# Patient Record
Sex: Female | Born: 1965 | Race: White | Hispanic: No | Marital: Married | Smoking: Never smoker
Health system: Southern US, Community
[De-identification: ages and names within clinical notes are randomized; demographics above are authoritative.]

## PROBLEM LIST (undated history)

## (undated) DIAGNOSIS — N83209 Unspecified ovarian cyst, unspecified side: Secondary | ICD-10-CM

## (undated) DIAGNOSIS — Z8041 Family history of malignant neoplasm of ovary: Secondary | ICD-10-CM

## (undated) DIAGNOSIS — N9489 Other specified conditions associated with female genital organs and menstrual cycle: Secondary | ICD-10-CM

## (undated) HISTORY — PX: WISDOM TOOTH EXTRACTION: SHX21

## (undated) HISTORY — PX: ADENOIDECTOMY: SHX5191

## (undated) HISTORY — DX: Unspecified ovarian cyst, unspecified side: N83.209

## (undated) HISTORY — DX: Family history of malignant neoplasm of ovary: Z80.41

---

## 2015-07-02 ENCOUNTER — Encounter: Payer: Self-pay | Admitting: Family Medicine

## 2015-07-02 ENCOUNTER — Ambulatory Visit (INDEPENDENT_AMBULATORY_CARE_PROVIDER_SITE_OTHER): Payer: 59 | Admitting: Family Medicine

## 2015-07-02 VITALS — BP 136/86 | HR 74 | Temp 97.5°F | Resp 20 | Ht 66.0 in | Wt 147.5 lb

## 2015-07-02 DIAGNOSIS — Z13220 Encounter for screening for lipoid disorders: Secondary | ICD-10-CM

## 2015-07-02 DIAGNOSIS — Z0000 Encounter for general adult medical examination without abnormal findings: Secondary | ICD-10-CM

## 2015-07-02 DIAGNOSIS — R1013 Epigastric pain: Secondary | ICD-10-CM

## 2015-07-02 DIAGNOSIS — Z7189 Other specified counseling: Secondary | ICD-10-CM | POA: Diagnosis not present

## 2015-07-02 DIAGNOSIS — Z131 Encounter for screening for diabetes mellitus: Secondary | ICD-10-CM

## 2015-07-02 DIAGNOSIS — Z130 Encounter for screening for diseases of the blood and blood-forming organs and certain disorders involving the immune mechanism: Secondary | ICD-10-CM

## 2015-07-02 DIAGNOSIS — Z1329 Encounter for screening for other suspected endocrine disorder: Secondary | ICD-10-CM | POA: Diagnosis not present

## 2015-07-02 DIAGNOSIS — Z7689 Persons encountering health services in other specified circumstances: Secondary | ICD-10-CM

## 2015-07-02 NOTE — Progress Notes (Signed)
Patient ID: Mary Warren, female   DOB: 29-Jun-1965, 50 y.o.   MRN: [id number]      Patient ID: Mary Warren, female  DOB: 1966-01-29, 50 y.o.   MRN: [id number]  Subjective:  Mary Warren is a 50 y.o. female present for establishment of care with acute complaint.  All past medical history, surgical history, allergies, family history, immunizations, medications and social history were entered in the electronic medical record today. All recent labs, ED visits and hospitalizations within the last year were reviewed.  Epigastric discomfort: Patient states that approximately 1 week ago she experienced epigastric discomfort just below the sternal area. She states she felt a pop, and then she was sore in the area for a few days after. She denies any nausea, vomit, diarrhea or lower abdominal pain. She denies any history of gallbladder disease. She states this occurred after lifting/working outdoors. This has not happened prior. She denies any reflux like symptoms.  Health maintenance:  Colonoscopy:Mammogram: No known family history, routine screening at 45 Cervical cancer screening: Ovarian cancer in mother, patient with routine Paps last Pap smear 2014. Patient reports no recent abnormal Paps. Patient continues her regular menses. Immunizations: Up-to-date with tetanus shot 2012, declines flu vaccination. Infectious disease screening: HIV completed 2012  History reviewed. No pertinent past medical history. No Known Allergies Past Surgical History  Procedure Laterality Date  . Adenoidectomy     Family History  Problem Relation Age of Onset  . Drug abuse Mother   . COPD Mother   . Cancer Mother   . Mental retardation Mother   . Early death Mother 68    blood clot  . Ovarian cancer Mother   . Heart disease Mother   . Cancer Paternal Aunt   . Breast cancer Paternal Aunt   . Alcohol abuse Sister    Social History   Social History  . Marital Status: Married    Spouse Name: N/A  .  Number of Children: N/A  . Years of Education: N/A   Occupational History  . Not on file.   Social History Main Topics  . Smoking status: Never Smoker   . Smokeless tobacco: Not on file  . Alcohol Use: 1.2 - 1.8 oz/week    2-3 Glasses of wine per week  . Drug Use: No  . Sexual Activity: Yes    Birth Control/ Protection: None   Other Topics Concern  . Not on file   Social History Narrative   Husband, Warden Fillers.   College degree. [employer]    ROS: Negative, with the exception of above mentioned in HPI  Objective: BP 136/86 mmHg  Pulse 74  Temp(Src) 97.5 F (36.4 C)  Resp 20  Ht '5\' 6"'  (1.676 m)  Wt 147 lb 8 oz (66.906 kg)  BMI 23.82 kg/m2  SpO2 99%  LMP 06/07/2015 (Approximate) Gen: Afebrile. No acute distress. Nontoxic in appearance, well-developed, well-nourished, female, Pleasant HENT: AT. [place]. Bilateral TM visualized and normal in appearance, normal external auditory canal. MMM, no oral lesions. Bilateral nares and throat within normal limits.  Eyes:Pupils Equal Round Reactive to light, Extraocular movements intact,  Conjunctiva without redness, discharge or icterus. Neck/lymp/endocrine: Supple, no lymphadenopathy CV: RRR no murmur, no edema, +2/4 P posterior tibialis pulses.  Chest: CTAB, no wheeze, rhonchi or crackles. * Abd: Soft. NTND. BS present. No masses palpated. No hepatosplenomegaly. No rebound or guarding. Skin: no rashes, purpura or petechiae Neuro/Msk: Normal gait. PERLA. EOMi. Alert. Oriented x3.    Assessment/plan: Mary Warren is  a 50 y.o. female present for establishment of care. Establishing care with new doctor, encounter for - Future labs place for preventive visit which patient was encouraged to schedule within 3-4 months. Patient was encouraged to have labs drawn approximately 2-3 days prior to scheduling the appointment for her preventative that we can discuss on the day of her appointment. - CBC w/Diff; Future - Comp Met (CMET); Future -  TSH; Future - Lipid Profile; Future - HgB A1c; Future  Epigastric discomfort - Discussed with patient what she describes the abdomen likely muscle skeletal/rib strain in nature or possibly a small hiatal hernia that is sliding. - Encourage patient to monitor for future occurrences, consider NSAID use if felt to be muscle skeletal. Patient was also educated on hiatal hernia, and other associated signs and symptoms that frequent. - Follow-up as needed   Return in about 3 months (around 10/02/2015), or cpe with pap, for CPE.  Electronically signed by: Howard Pouch, DO [place]

## 2015-07-02 NOTE — Patient Instructions (Signed)
It was a pleasure meeting you today. If you need anything we are here for you. Otherwise, make a appt within 1-52mos for annual with fasting labs prior.

## 2015-09-03 ENCOUNTER — Other Ambulatory Visit (INDEPENDENT_AMBULATORY_CARE_PROVIDER_SITE_OTHER): Payer: 59

## 2015-09-03 ENCOUNTER — Encounter (INDEPENDENT_AMBULATORY_CARE_PROVIDER_SITE_OTHER): Payer: Self-pay

## 2015-09-03 DIAGNOSIS — Z1329 Encounter for screening for other suspected endocrine disorder: Secondary | ICD-10-CM | POA: Diagnosis not present

## 2015-09-03 DIAGNOSIS — Z0000 Encounter for general adult medical examination without abnormal findings: Secondary | ICD-10-CM | POA: Diagnosis not present

## 2015-09-03 DIAGNOSIS — Z130 Encounter for screening for diseases of the blood and blood-forming organs and certain disorders involving the immune mechanism: Secondary | ICD-10-CM

## 2015-09-03 DIAGNOSIS — Z13220 Encounter for screening for lipoid disorders: Secondary | ICD-10-CM | POA: Diagnosis not present

## 2015-09-03 DIAGNOSIS — Z131 Encounter for screening for diabetes mellitus: Secondary | ICD-10-CM | POA: Diagnosis not present

## 2015-09-03 LAB — CBC WITH DIFFERENTIAL/PLATELET
BASOS PCT: 0.6 % (ref 0.0–3.0)
Basophils Absolute: 0 10*3/uL (ref 0.0–0.1)
EOS ABS: 0 10*3/uL (ref 0.0–0.7)
EOS PCT: 0.5 % (ref 0.0–5.0)
HCT: 37.8 % (ref 36.0–46.0)
Hemoglobin: 12.8 g/dL (ref 12.0–15.0)
LYMPHS ABS: 1.6 10*3/uL (ref 0.7–4.0)
Lymphocytes Relative: 31.7 % (ref 12.0–46.0)
MCHC: 33.8 g/dL (ref 30.0–36.0)
MCV: 92.1 fl (ref 78.0–100.0)
MONO ABS: 0.3 10*3/uL (ref 0.1–1.0)
Monocytes Relative: 6.5 % (ref 3.0–12.0)
NEUTROS ABS: 3.1 10*3/uL (ref 1.4–7.7)
NEUTROS PCT: 60.7 % (ref 43.0–77.0)
PLATELETS: 243 10*3/uL (ref 150.0–400.0)
RBC: 4.11 Mil/uL (ref 3.87–5.11)
RDW: 13.9 % (ref 11.5–15.5)
WBC: 5.1 10*3/uL (ref 4.0–10.5)

## 2015-09-03 LAB — HEMOGLOBIN A1C: Hgb A1c MFr Bld: 5.4 % (ref 4.6–6.5)

## 2015-09-03 LAB — COMPREHENSIVE METABOLIC PANEL
ALT: 11 U/L (ref 0–35)
AST: 13 U/L (ref 0–37)
Albumin: 4.4 g/dL (ref 3.5–5.2)
Alkaline Phosphatase: 37 U/L — ABNORMAL LOW (ref 39–117)
BUN: 14 mg/dL (ref 6–23)
CHLORIDE: 107 meq/L (ref 96–112)
CO2: 26 meq/L (ref 19–32)
CREATININE: 0.55 mg/dL (ref 0.40–1.20)
Calcium: 9.5 mg/dL (ref 8.4–10.5)
GFR: 125.12 mL/min (ref >60.00)
Glucose, Bld: 95 mg/dL (ref 70–99)
POTASSIUM: 4.7 meq/L (ref 3.5–5.1)
SODIUM: 140 meq/L (ref 135–145)
Total Bilirubin: 0.8 mg/dL (ref 0.2–1.2)
Total Protein: 7 g/dL (ref 6.0–8.3)

## 2015-09-03 LAB — LIPID PANEL
CHOL/HDL RATIO: 4
Cholesterol: 198 mg/dL (ref 0–200)
HDL: 54.3 mg/dL (ref >39.00)
LDL CALC: 130 mg/dL — AB (ref 0–99)
NonHDL: 143.23
Triglycerides: 68 mg/dL (ref 0.0–149.0)
VLDL: 13.6 mg/dL (ref 0.0–40.0)

## 2015-09-03 LAB — TSH: TSH: 3.62 u[IU]/mL (ref 0.35–4.50)

## 2015-09-07 ENCOUNTER — Other Ambulatory Visit (HOSPITAL_COMMUNITY)
Admission: RE | Admit: 2015-09-07 | Discharge: 2015-09-07 | Disposition: A | Discharge status: Home / Self Care | Payer: 59 | Source: Ambulatory Visit | Attending: Family Medicine | Admitting: Family Medicine

## 2015-09-07 ENCOUNTER — Ambulatory Visit (INDEPENDENT_AMBULATORY_CARE_PROVIDER_SITE_OTHER): Payer: 59 | Admitting: Family Medicine

## 2015-09-07 ENCOUNTER — Encounter: Payer: Self-pay | Admitting: Family Medicine

## 2015-09-07 VITALS — BP 113/72 | HR 74 | Temp 98.3°F | Resp 20 | Ht 66.0 in | Wt 146.8 lb

## 2015-09-07 DIAGNOSIS — Z1151 Encounter for screening for human papillomavirus (HPV): Secondary | ICD-10-CM | POA: Insufficient documentation

## 2015-09-07 DIAGNOSIS — Z1231 Encounter for screening mammogram for malignant neoplasm of breast: Secondary | ICD-10-CM

## 2015-09-07 DIAGNOSIS — Z0000 Encounter for general adult medical examination without abnormal findings: Secondary | ICD-10-CM | POA: Diagnosis not present

## 2015-09-07 DIAGNOSIS — Z01419 Encounter for gynecological examination (general) (routine) without abnormal findings: Secondary | ICD-10-CM | POA: Insufficient documentation

## 2015-09-07 DIAGNOSIS — N649 Disorder of breast, unspecified: Secondary | ICD-10-CM

## 2015-09-07 DIAGNOSIS — Z124 Encounter for screening for malignant neoplasm of cervix: Secondary | ICD-10-CM

## 2015-09-07 DIAGNOSIS — L988 Other specified disorders of the skin and subcutaneous tissue: Secondary | ICD-10-CM | POA: Insufficient documentation

## 2015-09-07 NOTE — Progress Notes (Signed)
Patient ID: Mary Warren, female   DOB: 13-Jun-1965, 50 y.o.   MRN: [id number]      Patient ID: Mary Warren, female  DOB: Feb 25, 1966, 50 y.o.   MRN: [id number]  Subjective:  Mary Warren is a 50 y.o. female present for annual exam/CPE and PAP.  All past medical history, surgical history, allergies, family history, immunizations, medications and social history were updated in the electronic medical record today. All recent labs, ED visits and hospitalizations within the last year were reviewed.  Well women exam: Patient states her menstrual cycle occurs approximately every 28-30 days, and will last approximately 7 days in length. She states her cycle alternates between heavy and light cycles. She does not sleep through a pad or tampon greater than 2 hours. She denies any vaginal discharge, lesions or dyspareunia. Patient performs self breast exams, denies any masses or suspicious lumps. Patient has a family history of breast cancer in her paternal aunt. She has never had a screening mammogram secondary to consider radiation exposure. There is no family history of colon cancer. Patient's last menstrual period was 08/09/2015.  Skin Lesion: pt states she has right breast skin lesion after a sunburn in the past. She has noticed it is remaining, not healing or going away over the past 2 months. She has never had skin cancer.   Health maintenance:  Colonoscopy: No FHX, routine screen 51 Mammogram: FH in Pat aunt (60), routine screening should start---> ordered today, pt has "reservations of radiation exposure" Cervical cancer screening: Ovarian cancer in mother, patient with routine Paps last Pap smear 2014. Patient reports no recent abnormal Paps. Patient continues her regular menses. Immunizations: Up-to-date with tetanus shot 2012, declines flu vaccination. Infectious disease screening: HIV completed 2012 Assistive device: None  Oxygen [facility]:EYEM  Patient has a Dental home. Hospitalizations/ED  visits: None   Recent Results (from the past 2160 hour(s))  CBC w/Diff     Status: None   Collection Time: 09/03/15  8:04 AM  Result Value Ref Range   WBC 5.1 4.0 - 10.5 K/uL   RBC 4.11 3.87 - 5.11 Mil/uL   Hemoglobin 12.8 12.0 - 15.0 g/dL   HCT 37.8 36.0 - 46.0 %   MCV 92.1 78.0 - 100.0 fl   MCHC 33.8 30.0 - 36.0 g/dL   RDW 13.9 11.5 - 15.5 %   Platelets 243.0 150.0 - 400.0 K/uL   Neutrophils Relative % 60.7 43.0 - 77.0 %   Lymphocytes Relative 31.7 12.0 - 46.0 %   Monocytes Relative 6.5 3.0 - 12.0 %   Eosinophils Relative 0.5 0.0 - 5.0 %   Basophils Relative 0.6 0.0 - 3.0 %   Neutro Abs 3.1 1.4 - 7.7 K/uL   Lymphs Abs 1.6 0.7 - 4.0 K/uL   Monocytes Absolute 0.3 0.1 - 1.0 K/uL   Eosinophils Absolute 0.0 0.0 - 0.7 K/uL   Basophils Absolute 0.0 0.0 - 0.1 K/uL  Comp Met (CMET)     Status: Abnormal   Collection Time: 09/03/15  8:04 AM  Result Value Ref Range   Sodium 140 135 - 145 mEq/L   Potassium 4.7 3.5 - 5.1 mEq/L   Chloride 107 96 - 112 mEq/L   CO2 26 19 - 32 mEq/L   Glucose, Bld 95 70 - 99 mg/dL   BUN 14 6 - 23 mg/dL   Creatinine, Ser 0.55 0.40 - 1.20 mg/dL   Total Bilirubin 0.8 0.2 - 1.2 mg/dL   Alkaline Phosphatase 37 (L) 39 - 117 U/L  AST 13 0 - 37 U/L   ALT 11 0 - 35 U/L   Total Protein 7.0 6.0 - 8.3 g/dL   Albumin 4.4 3.5 - 5.2 g/dL   Calcium 9.5 8.4 - 10.5 mg/dL   GFR 125.12 >60.00 mL/min  TSH     Status: None   Collection Time: 09/03/15  8:04 AM  Result Value Ref Range   TSH 3.62 0.35 - 4.50 uIU/mL  Lipid Profile     Status: Abnormal   Collection Time: 09/03/15  8:04 AM  Result Value Ref Range   Cholesterol 198 0 - 200 mg/dL    Comment: ATP III Classification       Desirable:  < 200 mg/dL               Borderline High:  200 - 239 mg/dL          High:  > = 240 mg/dL   Triglycerides 68.0 0.0 - 149.0 mg/dL    Comment: Normal:  <150 mg/dLBorderline High:  150 - 199 mg/dL   HDL 54.30 >39.00 mg/dL   VLDL 13.6 0.0 - 40.0 mg/dL   LDL Cholesterol 130 (H) 0 -  99 mg/dL   Total CHOL/HDL Ratio 4     Comment:                Men          Women1/2 Average Risk     3.4          3.3Average Risk          5.0          4.42X Average Risk          9.6          7.13X Average Risk          15.0          11.0                       NonHDL 143.23     Comment: NOTE:  Non-HDL goal should be 30 mg/dL higher than patient's LDL goal (i.e. LDL goal of < 70 mg/dL, would have non-HDL goal of < 100 mg/dL)  HgB A1c     Status: None   Collection Time: 09/03/15  8:04 AM  Result Value Ref Range   Hgb A1c MFr Bld 5.4 4.6 - 6.5 %    Comment: Glycemic Control Guidelines for People with Diabetes:Non Diabetic:  <6%Goal of Therapy: <7%Additional Action Suggested:  >8%      History reviewed. No pertinent past medical history. No Known Allergies Past Surgical History  Procedure Laterality Date  . Adenoidectomy     Family History  Problem Relation Age of Onset  . Drug abuse Mother   . COPD Mother   . Cancer Mother   . Mental retardation Mother   . Early death Mother 42    blood clot  . Ovarian cancer Mother   . Heart disease Mother   . Cancer Paternal Aunt   . Breast cancer Paternal Aunt   . Alcohol abuse Sister    Social History   Social History  . Marital Status: Married    Spouse Name: N/A  . Number of Children: N/A  . Years of Education: N/A   Occupational History  . Not on file.   Social History Main Topics  . Smoking status: Never Smoker   . Smokeless tobacco: Not on file  . Alcohol Use:  1.2 - 1.8 oz/week    2-3 Glasses of wine per week  . Drug Use: No  . Sexual Activity: Yes    Birth Control/ Protection: None   Other Topics Concern  . 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   Exercises but nor routinely.    Smoke detector in the home.   Wears her set belt.   Feels safe in her relationships.        ROS: Negative, with the exception of above mentioned in  HPI  Objective: BP 113/72 mmHg  Pulse 74  Temp(Src) 98.3 F (36.8 C) (Oral)  Resp 20  Ht _0  (1.676 m)  Wt 146 lb 12 oz (66.565 kg)  BMI 23.70 kg/m2  SpO2 98%  LMP 08/09/2015 Gen: Afebrile. No acute distress. Nontoxic in appearance, well-developed, well-nourished,Caucasian female, Pleasant HENT: AT. [place]. Bilateral TM visualized and normal in appearance, normal external auditory canal. MMM, no oral lesions, good dentition. Bilateral nares without erythema or swelling. Throat without erythema, ulcerations or exudates. No Cough on exam, no hoarseness on exam. Eyes:Pupils Equal Round Reactive to light, Extraocular movements intact,  Conjunctiva without redness, discharge or icterus. Neck/lymp/endocrine: Supple, no lymphadenopathy, no thyromegaly CV: RRR no murmur appreciated, no edema, +2/4 P posterior tibialis pulses. No carotid bruits. No JVD. Chest: CTAB, no wheeze, rhonchi or crackles. Normal Respiratory effort. Good Air movement. Abd: Soft. Flat. NTND. BS present. No Masses palpated. No hepatosplenomegaly. No rebound tenderness or guarding. Skin: No rashes, purpura or petechiae. Warm and well-perfused. Skin intact. Small hyperpigmented pearly/scaly <0.5cm skin lesion right chest wall. Neuro/Msk:  Normal gait. PERLA. EOMi. Alert. Oriented x3.  Cranial nerves II through XII intact. Muscle strength 5/5 upper and lower extremity. DTRs equal bilaterally. Psych: Normal affect, dress and demeanor. Normal speech. Normal thought content and judgment. Breasts: breasts appear normal, symmetrical, no tenderness on exam, no suspicious masses, no skin or nipple changes or axillary nodes. GYN:  External genitalia within normal limits, normal hair distribution, no lesions. Urethral meatus normal, no lesions. Vaginal mucosa pink, moist, normal rugae, no lesions. No cystocele or rectocele. cervix without lesions, no discharge, mildly friable. Bimanual exam revealed normal uterus.  No bladder/suprapubic  fullness, masses or tenderness. No cervical motion tenderness. No adnexal fullness. Anus and perineum within normal limits, no lesions.  Assessment/plan: Mary Warren is a 50 y.o. female present for annual exam.  1. Encounter for preventive health examination - labs reviewed with patient in detail.  Patient was encouraged to exercise greater than 150 minutes a week. Patient was encouraged to choose a diet filled with fresh fruits and vegetables, and lean meats. AVS provided to patient today for education/recommendation on gender specific health and safety maintenance. Colonoscopy: No FHX, routine screen 51 Mammogram: FH in Pat aunt (60), routine screening should start---> ordered today, pt has "reservations of radiation exposure" Cervical cancer screening: Completed today Immunizations: Up-to-date with tetanus shot 2012, declines flu vaccination. Infectious disease screening: HIV completed 2012 Patient encouraged to continue vitamin D supplementation and calcium. Continue fish oil supplementation.  2. Encounter for screening mammogram for breast cancer - Pt has some concerns of radiation exposure.  - MM DIGITAL SCREENING BILATERAL; Future  3. Encounter for screening for cervical cancer  - PAP with HPV co- testing completed today.   4. Skin lesion of breast -? BCC, pt counseled on sun screen use.  - Ambulatory referral to Dermatology  Return in about 1  year (around 09/06/2016) for CPE.  Electronically signed by: Howard Pouch, DO [place]

## 2015-09-07 NOTE — Patient Instructions (Signed)
Health Maintenance, Female Adopting a healthy lifestyle and getting preventive care can go a long way to promote health and wellness. Talk with your health care provider about what schedule of regular examinations is right for you. This is a good chance for you to check in with your provider about disease prevention and staying healthy. In between checkups, there are plenty of things you can do on your own. Experts have done a lot of research about which lifestyle changes and preventive measures are most likely to keep you healthy. Ask your health care provider for more information. WEIGHT AND DIET  Eat a healthy diet  Be sure to include plenty of vegetables, fruits, low-fat dairy products, and lean protein.  Do not eat a lot of foods high in solid fats, added sugars, or salt.  Get regular exercise. This is one of the most important things you can do for your health.  Most adults should exercise for at least 150 minutes each week. The exercise should increase your heart rate and make you sweat (moderate-intensity exercise).  Most adults should also do strengthening exercises at least twice a week. This is in addition to the moderate-intensity exercise.  Maintain a healthy weight  Body mass index (BMI) is a measurement that can be used to identify possible weight problems. It estimates body fat based on height and weight. Your health care provider can help determine your BMI and help you achieve or maintain a healthy weight.  For females 20 years of age and older:   A BMI below 18.5 is considered underweight.  A BMI of 18.5 to 24.9 is normal.  A BMI of 25 to 29.9 is considered overweight.  A BMI of 30 and above is considered obese.  Watch levels of cholesterol and blood lipids  You should start having your blood tested for lipids and cholesterol at 49 years of age, then have this test every 5 years.  You may need to have your cholesterol levels checked more often if:  Your lipid  or cholesterol levels are high.  You are older than 50 years of age.  You are at high risk for heart disease.  CANCER SCREENING   Lung Cancer  Lung cancer screening is recommended for adults 55-80 years old who are at high risk for lung cancer because of a history of smoking.  A yearly low-dose CT scan of the lungs is recommended for people who:  Currently smoke.  Have quit within the past 15 years.  Have at least a 30-pack-year history of smoking. A pack year is smoking an average of one pack of cigarettes a day for 1 year.  Yearly screening should continue until it has been 15 years since you quit.  Yearly screening should stop if you develop a health problem that would prevent you from having lung cancer treatment.  Breast Cancer  Practice breast self-awareness. This means understanding how your breasts normally appear and feel.  It also means doing regular breast self-exams. Let your health care provider know about any changes, no matter how small.  If you are in your 20s or 30s, you should have a clinical breast exam (CBE) by a health care provider every 1-3 years as part of a regular health exam.  If you are 40 or older, have a CBE every year. Also consider having a breast X-ray (mammogram) every year.  If you have a family history of breast cancer, talk to your health care provider about genetic screening.  If you   are at high risk for breast cancer, talk to your health care provider about having an MRI and a mammogram every year.  Breast cancer gene (BRCA) assessment is recommended for women who have family members with BRCA-related cancers. BRCA-related cancers include:  Breast.  Ovarian.  Tubal.  Peritoneal cancers.  Results of the assessment will determine the need for genetic counseling and BRCA1 and BRCA2 testing. Cervical Cancer Your health care provider may recommend that you be screened regularly for cancer of the pelvic organs (ovaries, uterus, and  vagina). This screening involves a pelvic examination, including checking for microscopic changes to the surface of your cervix (Pap test). You may be encouraged to have this screening done every 3 years, beginning at age 21.  For women ages 30-65, health care providers may recommend pelvic exams and Pap testing every 3 years, or they may recommend the Pap and pelvic exam, combined with testing for human papilloma virus (HPV), every 5 years. Some types of HPV increase your risk of cervical cancer. Testing for HPV may also be done on women of any age with unclear Pap test results.  Other health care providers may not recommend any screening for nonpregnant women who are considered low risk for pelvic cancer and who do not have symptoms. Ask your health care provider if a screening pelvic exam is right for you.  If you have had past treatment for cervical cancer or a condition that could lead to cancer, you need Pap tests and screening for cancer for at least 20 years after your treatment. If Pap tests have been discontinued, your risk factors (such as having a new sexual partner) need to be reassessed to determine if screening should resume. Some women have medical problems that increase the chance of getting cervical cancer. In these cases, your health care provider may recommend more frequent screening and Pap tests. Colorectal Cancer  This type of cancer can be detected and often prevented.  Routine colorectal cancer screening usually begins at 50 years of age and continues through 49 years of age.  Your health care provider may recommend screening at an earlier age if you have risk factors for colon cancer.  Your health care provider may also recommend using home test kits to check for hidden blood in the stool.  A small camera at the end of a tube can be used to examine your colon directly (sigmoidoscopy or colonoscopy). This is done to check for the earliest forms of colorectal  cancer.  Routine screening usually begins at age 50.  Direct examination of the colon should be repeated every 5-10 years through 49 years of age. However, you may need to be screened more often if early forms of precancerous polyps or small growths are found. Skin Cancer  Check your skin from head to toe regularly.  Tell your health care provider about any new moles or changes in moles, especially if there is a change in a mole's shape or color.  Also tell your health care provider if you have a mole that is larger than the size of a pencil eraser.  Always use sunscreen. Apply sunscreen liberally and repeatedly throughout the day.  Protect yourself by wearing long sleeves, pants, a wide-brimmed hat, and sunglasses whenever you are outside. HEART DISEASE, DIABETES, AND HIGH BLOOD PRESSURE   High blood pressure causes heart disease and increases the risk of stroke. High blood pressure is more likely to develop in:  People who have blood pressure in the high end   of the normal range (130-139/85-89 mm Hg).  People who are overweight or obese.  People who are African American.  If you are 38-23 years of age, have your blood pressure checked every 3-5 years. If you are 61 years of age or older, have your blood pressure checked every year. You should have your blood pressure measured twice--once when you are at a hospital or clinic, and once when you are not at a hospital or clinic. Record the average of the two measurements. To check your blood pressure when you are not at a hospital or clinic, you can use:  An automated blood pressure machine at a pharmacy.  A home blood pressure monitor.  If you are between 45 years and 39 years old, ask your health care provider if you should take aspirin to prevent strokes.  Have regular diabetes screenings. This involves taking a blood sample to check your fasting blood sugar level.  If you are at a normal weight and have a low risk for diabetes,  have this test once every three years after 49 years of age.  If you are overweight and have a high risk for diabetes, consider being tested at a younger age or more often. PREVENTING INFECTION  Hepatitis B  If you have a higher risk for hepatitis B, you should be screened for this virus. You are considered at high risk for hepatitis B if:  You were born in a country where hepatitis B is common. Ask your health care provider which countries are considered high risk.  Your parents were born in a high-risk country, and you have not been immunized against hepatitis B (hepatitis B vaccine).  You have HIV or AIDS.  You use needles to inject street drugs.  You live with someone who has hepatitis B.  You have had sex with someone who has hepatitis B.  You get hemodialysis treatment.  You take certain medicines for conditions, including cancer, organ transplantation, and autoimmune conditions. Hepatitis C  Blood testing is recommended for:  Everyone born from [birth year] through 1964.  Anyone with known risk factors for hepatitis C. Sexually transmitted infections (STIs)  You should be screened for sexually transmitted infections (STIs) including gonorrhea and chlamydia if:  You are sexually active and are younger than 49 years of age.  You are older than 49 years of age and your health care provider tells you that you are at risk for this type of infection.  Your sexual activity has changed since you were last screened and you are at an increased risk for chlamydia or gonorrhea. Ask your health care provider if you are at risk.  If you do not have HIV, but are at risk, it may be recommended that you take a prescription medicine daily to prevent HIV infection. This is called pre-exposure prophylaxis (PrEP). You are considered at risk if:  You are sexually active and do not regularly use condoms or know the HIV status of your partner(s).  You take drugs by injection.  You are sexually  active with a partner who has HIV. Talk with your health care provider about whether you are at high risk of being infected with HIV. If you choose to begin PrEP, you should first be tested for HIV. You should then be tested every 3 months for as long as you are taking PrEP.  PREGNANCY   If you are premenopausal and you may become pregnant, ask your health care provider about preconception counseling.  If you may  become pregnant, take 400 to 800 micrograms (mcg) of folic acid every day.  If you want to prevent pregnancy, talk to your health care provider about birth control (contraception). OSTEOPOROSIS AND MENOPAUSE   Osteoporosis is a disease in which the bones lose minerals and strength with aging. This can result in serious bone fractures. Your risk for osteoporosis can be identified using a bone density scan.  If you are 61 years of age or older, or if you are at risk for osteoporosis and fractures, ask your health care provider if you should be screened.  Ask your health care provider whether you should take a calcium or vitamin D supplement to lower your risk for osteoporosis.  Menopause may have certain physical symptoms and risks.  Hormone replacement therapy may reduce some of these symptoms and risks. Talk to your health care provider about whether hormone replacement therapy is right for you.  HOME CARE INSTRUCTIONS   Schedule regular health, dental, and eye exams.  Stay current with your immunizations.   Do not use any tobacco products including cigarettes, chewing tobacco, or electronic cigarettes.  If you are pregnant, do not drink alcohol.  If you are breastfeeding, limit how much and how often you drink alcohol.  Limit alcohol intake to no more than 1 drink per day for nonpregnant women. One drink equals 12 ounces of beer, 5 ounces of wine, or 1 ounces of hard liquor.  Do not use street drugs.  Do not share needles.  Ask your health care provider for help if  you need support or information about quitting drugs.  Tell your health care provider if you often feel depressed.  Tell your health care provider if you have ever been abused or do not feel safe at home.   This information is not intended to replace advice given to you by your health care provider. Make sure you discuss any questions you have with your health care provider.   Document Released: 10/24/2010 Document Revised: 05/01/2014 Document Reviewed: 03/12/2013 Elsevier Interactive Patient Education [employer].

## 2015-09-08 LAB — CYTOLOGY - PAP

## 2015-09-09 ENCOUNTER — Telehealth: Payer: Self-pay | Admitting: Family Medicine

## 2015-09-09 NOTE — Telephone Encounter (Signed)
Please call pt: - PAP smear is normal, with neg HPV co-testing--> next PAP 3 years

## 2015-09-09 NOTE — Telephone Encounter (Signed)
Left message with results on patient voice mail 

## 2015-10-15 ENCOUNTER — Ambulatory Visit
Admission: RE | Admit: 2015-10-15 | Discharge: 2015-10-15 | Disposition: A | Discharge status: Home / Self Care | Payer: 59 | Source: Ambulatory Visit | Attending: Family Medicine | Admitting: Family Medicine

## 2015-10-15 ENCOUNTER — Other Ambulatory Visit: Payer: Self-pay | Admitting: Family Medicine

## 2015-10-15 DIAGNOSIS — Z1231 Encounter for screening mammogram for malignant neoplasm of breast: Secondary | ICD-10-CM

## 2016-08-07 ENCOUNTER — Ambulatory Visit (INDEPENDENT_AMBULATORY_CARE_PROVIDER_SITE_OTHER): Payer: 59 | Admitting: Family Medicine

## 2016-08-07 ENCOUNTER — Encounter: Payer: Self-pay | Admitting: Family Medicine

## 2016-08-07 VITALS — BP 106/64 | HR 67 | Temp 98.1°F | Resp 20 | Ht 66.0 in | Wt 143.5 lb

## 2016-08-07 DIAGNOSIS — Z0000 Encounter for general adult medical examination without abnormal findings: Secondary | ICD-10-CM | POA: Diagnosis not present

## 2016-08-07 DIAGNOSIS — Z131 Encounter for screening for diabetes mellitus: Secondary | ICD-10-CM

## 2016-08-07 DIAGNOSIS — Z130 Encounter for screening for diseases of the blood and blood-forming organs and certain disorders involving the immune mechanism: Secondary | ICD-10-CM

## 2016-08-07 DIAGNOSIS — Z1231 Encounter for screening mammogram for malignant neoplasm of breast: Secondary | ICD-10-CM | POA: Diagnosis not present

## 2016-08-07 DIAGNOSIS — E7800 Pure hypercholesterolemia, unspecified: Secondary | ICD-10-CM | POA: Diagnosis not present

## 2016-08-07 DIAGNOSIS — Z1329 Encounter for screening for other suspected endocrine disorder: Secondary | ICD-10-CM

## 2016-08-07 DIAGNOSIS — Z9189 Other specified personal risk factors, not elsewhere classified: Secondary | ICD-10-CM | POA: Diagnosis not present

## 2016-08-07 LAB — CBC WITH DIFFERENTIAL/PLATELET
BASOS PCT: 0.6 % (ref 0.0–3.0)
Basophils Absolute: 0 10*3/uL (ref 0.0–0.1)
EOS PCT: 0.4 % (ref 0.0–5.0)
Eosinophils Absolute: 0 10*3/uL (ref 0.0–0.7)
HCT: 38.1 % (ref 36.0–46.0)
HEMOGLOBIN: 12.7 g/dL (ref 12.0–15.0)
LYMPHS ABS: 1.5 10*3/uL (ref 0.7–4.0)
Lymphocytes Relative: 23.8 % (ref 12.0–46.0)
MCHC: 33.4 g/dL (ref 30.0–36.0)
MCV: 94.4 fl (ref 78.0–100.0)
Monocytes Absolute: 0.3 10*3/uL (ref 0.1–1.0)
Monocytes Relative: 5.2 % (ref 3.0–12.0)
NEUTROS ABS: 4.5 10*3/uL (ref 1.4–7.7)
Neutrophils Relative %: 70 % (ref 43.0–77.0)
Platelets: 272 10*3/uL (ref 150.0–400.0)
RBC: 4.03 Mil/uL (ref 3.87–5.11)
RDW: 13.4 % (ref 11.5–15.5)
WBC: 6.5 10*3/uL (ref 4.0–10.5)

## 2016-08-07 LAB — LIPID PANEL
CHOLESTEROL: 185 mg/dL (ref 0–200)
HDL: 61.7 mg/dL (ref >39.00)
LDL Cholesterol: 106 mg/dL — ABNORMAL HIGH (ref 0–99)
NonHDL: 123.5
Total CHOL/HDL Ratio: 3
Triglycerides: 86 mg/dL (ref 0.0–149.0)
VLDL: 17.2 mg/dL (ref 0.0–40.0)

## 2016-08-07 LAB — COMPLETE METABOLIC PANEL WITH GFR
ALBUMIN: 3.9 g/dL (ref 3.6–5.1)
ALK PHOS: 37 U/L (ref 33–115)
ALT: 9 U/L (ref 6–29)
AST: 15 U/L (ref 10–35)
BUN: 14 mg/dL (ref 7–25)
CALCIUM: 9.3 mg/dL (ref 8.6–10.2)
CO2: 27 mmol/L (ref 20–31)
Chloride: 104 mmol/L (ref 98–110)
Creat: 0.49 mg/dL — ABNORMAL LOW (ref 0.50–1.10)
GFR, Est African American: >89 mL/min (ref >=60)
GLUCOSE: 87 mg/dL (ref 65–99)
POTASSIUM: 4.3 mmol/L (ref 3.5–5.3)
SODIUM: 139 mmol/L (ref 135–146)
Total Bilirubin: 0.7 mg/dL (ref 0.2–1.2)
Total Protein: 7 g/dL (ref 6.1–8.1)

## 2016-08-07 LAB — TSH: TSH: 2.23 u[IU]/mL (ref 0.35–4.50)

## 2016-08-07 NOTE — Patient Instructions (Signed)
Health Maintenance, Female Adopting a healthy lifestyle and getting preventive care can go a long way to promote health and wellness. Talk with your health care provider about what schedule of regular examinations is right for you. This is a good chance for you to check in with your provider about disease prevention and staying healthy. In between checkups, there are plenty of things you can do on your own. Experts have done a lot of research about which lifestyle changes and preventive measures are most likely to keep you healthy. Ask your health care provider for more information. Weight and diet Eat a healthy diet  Be sure to include plenty of vegetables, fruits, low-fat dairy products, and lean protein.  Do not eat a lot of foods high in solid fats, added sugars, or salt.  Get regular exercise. This is one of the most important things you can do for your health.  Most adults should exercise for at least 150 minutes each week. The exercise should increase your heart rate and make you sweat (moderate-intensity exercise).  Most adults should also do strengthening exercises at least twice a week. This is in addition to the moderate-intensity exercise. Maintain a healthy weight  Body mass index (BMI) is a measurement that can be used to identify possible weight problems. It estimates body fat based on height and weight. Your health care provider can help determine your BMI and help you achieve or maintain a healthy weight.  For females 50 years of age and older:  A BMI below 18.5 is considered underweight.  A BMI of 18.5 to 24.9 is normal.  A BMI of 25 to 29.9 is considered overweight.  A BMI of 30 and above is considered obese. Watch levels of cholesterol and blood lipids  You should start having your blood tested at 50 years of age for lipids and cholesterol at 50 years of age, then have this test every 5 years.  You may need to have your cholesterol levels checked more often if:  Your lipid or  cholesterol levels are high.  You are older than 50 years of age.  You are at high risk for heart disease. Cancer screening Lung Cancer  Lung cancer screening is recommended for adults 50-42 years old who are at high risk for lung cancer because of a history of smoking.  A yearly low-dose CT scan of the lungs is recommended for people who:  Currently smoke.  Have quit within the past 15 years.  Have at least a 30-pack-year history of smoking. A pack year is smoking an average of one pack of cigarettes a day for 1 year.  Yearly screening should continue until it has been 15 years since you quit.  Yearly screening should stop if you develop a health problem that would prevent you from having lung cancer treatment. Breast Cancer  Practice breast self-awareness. This means understanding how your breasts normally appear and feel.  It also means doing regular breast self-exams. Let your health care provider know about any changes, no matter how small.  If you are in your 20s or 30s, you should have a clinical breast exam (CBE) by a health care provider every 1-3 years as part of a regular health exam.  If you are 34 or older, have a CBE every year. Also consider having a breast X-ray (mammogram) every year.  If you have a family history of breast cancer, talk to your health care provider about genetic screening.  If you are at high risk for breast cancer, talk  to your health care provider about having an MRI and a mammogram every year.  Breast cancer gene (BRCA) assessment is recommended for women who have family members with BRCA-related cancers. BRCA-related cancers include:  Breast.  Ovarian.  Tubal.  Peritoneal cancers.  Results of the assessment will determine the need for genetic counseling and BRCA1 and BRCA2 testing. Cervical Cancer  Your health care provider may recommend that you be screened regularly for cancer of the pelvic organs (ovaries, uterus, and vagina).  This screening involves a pelvic examination, including checking for microscopic changes to the surface of your cervix (Pap test). You may be encouraged to have this screening done every 3 years, beginning at age 24.  For women ages 66-65, health care providers may recommend pelvic exams and Pap testing every 3 years, or they may recommend the Pap and pelvic exam, combined with testing for human papilloma virus (HPV), every 5 years. Some types of HPV increase your risk of cervical cancer. Testing for HPV may also be done on women of any age with unclear Pap test results.  Other health care providers may not recommend any screening for nonpregnant women who are considered low risk for pelvic cancer and who do not have symptoms. Ask your health care provider if a screening pelvic exam is right for you.  If you have had past treatment for cervical cancer or a condition that could lead to cancer, you need Pap tests and screening for cancer for at least 50 years after your treatment. If Pap tests have been discontinued, your risk factors (such as having a new sexual partner) need to be reassessed to determine if screening should resume. Some women have medical problems that increase the chance of getting cervical cancer. In these cases, your health care provider may recommend more frequent screening and Pap tests. Colorectal Cancer  This type of cancer can be detected and often prevented.  Routine colorectal cancer screening usually begins at 50 years of age and continues through 50 years of age.  Your health care provider may recommend screening at an earlier age if you have risk factors for colon cancer.  Your health care provider may also recommend using home test kits to check for hidden blood in the stool.  A small camera at the end of a tube can be used to examine your colon directly (sigmoidoscopy or colonoscopy). This is done to check for the earliest forms of colorectal cancer.  Routine  screening usually begins at age 50.  Direct examination of the colon should be repeated every 5-10 years through 50 years of age. However, you may need to be screened more often if early forms of precancerous polyps or small growths are found. Skin Cancer  Check your skin from head to toe regularly.  Tell your health care provider about any new moles or changes in moles, especially if there is a change in a mole's shape or color.  Also tell your health care provider if you have a mole that is larger than the size of a pencil eraser.  Always use sunscreen. Apply sunscreen liberally and repeatedly throughout the day.  Protect yourself by wearing long sleeves, pants, a wide-brimmed hat, and sunglasses whenever you are outside. Heart disease, diabetes, and high blood pressure  High blood pressure causes heart disease and increases the risk of stroke. High blood pressure is more likely to develop in:  People who have blood pressure in the high end of the normal range (130-139/85-89 mm Hg).  People who are overweight or obese.  People who are African American.  If you are 59-24 years of age, have your blood pressure checked every 3-5 years. If you are 34 years of age or older, have your blood pressure checked every year. You should have your blood pressure measured twice-once when you are at a hospital or clinic, and once when you are not at a hospital or clinic. Record the average of the two measurements. To check your blood pressure when you are not at a hospital or clinic, you can use:  An automated blood pressure machine at a pharmacy.  A home blood pressure monitor.  If you are between 29 years and 60 years old, ask your health care provider if you should take aspirin to prevent strokes.  Have regular diabetes screenings. This involves taking a blood sample to check your fasting blood sugar level.  If you are at a normal weight and have a low risk for diabetes, have this test once  every three years after 50 years of age.  If you are overweight and have a high risk for diabetes, consider being tested at a younger age or more often. Preventing infection Hepatitis B  If you have a higher risk for hepatitis B, you should be screened for this virus. You are considered at high risk for hepatitis B if:  You were born in a country where hepatitis B is common. Ask your health care provider which countries are considered high risk.  Your parents were born in a high-risk country, and you have not been immunized against hepatitis B (hepatitis B vaccine).  You have HIV or AIDS.  You use needles to inject street drugs.  You live with someone who has hepatitis B.  You have had sex with someone who has hepatitis B.  You get hemodialysis treatment.  You take certain medicines for conditions, including cancer, organ transplantation, and autoimmune conditions. Hepatitis C  Blood testing is recommended for:  Everyone born from [birth year] through 1964.  Anyone with known risk factors for hepatitis C. Sexually transmitted infections (STIs)  You should be screened for sexually transmitted infections (STIs) including gonorrhea and chlamydia if:  You are sexually active and are younger than 50 years of age.  You are older than 50 years of age and your health care provider tells you that you are at risk for this type of infection.  Your sexual activity has changed since you were last screened and you are at an increased risk for chlamydia or gonorrhea. Ask your health care provider if you are at risk.  If you do not have HIV, but are at risk, it may be recommended that you take a prescription medicine daily to prevent HIV infection. This is called pre-exposure prophylaxis (PrEP). You are considered at risk if:  You are sexually active and do not regularly use condoms or know the HIV status of your partner(s).  You take drugs by injection.  You are sexually active with a partner  who has HIV. Talk with your health care provider about whether you are at high risk of being infected with HIV. If you choose to begin PrEP, you should first be tested for HIV. You should then be tested every 3 months for as long as you are taking PrEP. Pregnancy  If you are premenopausal and you may become pregnant, ask your health care provider about preconception counseling.  If you may become pregnant, take 400 to 800 micrograms (mcg) of folic acid  every day.  If you want to prevent pregnancy, talk to your health care provider about birth control (contraception). Osteoporosis and menopause  Osteoporosis is a disease in which the bones lose minerals and strength with aging. This can result in serious bone fractures. Your risk for osteoporosis can be identified using a bone density scan.  If you are 38 years of age or older, or if you are at risk for osteoporosis and fractures, ask your health care provider if you should be screened.  Ask your health care provider whether you should take a calcium or vitamin D supplement to lower your risk for osteoporosis.  Menopause may have certain physical symptoms and risks.  Hormone replacement therapy may reduce some of these symptoms and risks. Talk to your health care provider about whether hormone replacement therapy is right for you. Follow these instructions at home:  Schedule regular health, dental, and eye exams.  Stay current with your immunizations.  Do not use any tobacco products including cigarettes, chewing tobacco, or electronic cigarettes.  If you are pregnant, do not drink alcohol.  If you are breastfeeding, limit how much and how often you drink alcohol.  Limit alcohol intake to no more than 1 drink per day for nonpregnant women. One drink equals 12 ounces of beer, 5 ounces of wine, or 1 ounces of hard liquor.  Do not use street drugs.  Do not share needles.  Ask your health care provider for help if you need support  or information about quitting drugs.  Tell your health care provider if you often feel depressed.  Tell your health care provider if you have ever been abused or do not feel safe at home. This information is not intended to replace advice given to you by your health care provider. Make sure you discuss any questions you have with your health care provider. Document Released: 10/24/2010 Document Revised: 09/16/2015 Document Reviewed: 01/12/2015 Elsevier Interactive Patient Education  2017 [employer].  Please help [place] help you:  We are honored you have chosen [facility] for your Primary Care home. Below you will find basic instructions that you may need to access in the future. Please help [place] help you by reading the instructions, which cover many of the frequent questions we experience.   Prescription refills and request:  -In order to allow more efficient response time, please call your pharmacy for all refills. They will forward the request electronically to [place]. This allows for the quickest possible response. Request left on a nurse line can take longer to refill, since these are checked as time allows between office patients and other phone calls.  - refill request can take up to 3-5 working days to complete.  - If request is sent electronically and request is appropiate, it is usually completed in 1-2 business days.  - all patients will need to be seen routinely for all chronic medical conditions requiring prescription medications (see follow-up below). If you are overdue for follow up on your condition, you will be asked to make an appointment and we will call in enough medication to cover you until your appointment (up to 30 days).  - all controlled substances will require a face to face visit to request/refill.  - if you desire your prescriptions to go through a new pharmacy, and have an active script at original pharmacy, you will need to call your pharmacy and have scripts  transferred to new pharmacy. This is completed between the pharmacy locations and not  by your provider.    Results: If any images or labs were ordered, it can take up to 1 week to get results depending on the test ordered and the lab/facility running and resulting the test. - Normal or stable results, which do not need further discussion, will be released to your mychart immediately with attached note to you. A call will not be generated for normal results. Please make certain to sign up for mychart. If you have questions on how to activate your mychart you can call the front office.  - If your results need further discussion, our office will attempt to contact you via phone, and if unable to reach you after 2 attempts, we will release your abnormal result to your mychart with instructions.  - All results will be automatically released in mychart after 1 week.  - Your provider will provide you with explanation and instruction on all relevant material in your results. Please keep in mind, results and labs may appear confusing or abnormal to the untrained eye, but it does not mean they are actually abnormal for you personally. If you have any questions about your results that are not covered, or you desire more detailed explanation than what was provided, you should make an appointment with your provider to do so.   Our office handles many outgoing and incoming calls daily. If we have not contacted you within 1 week about your results, please check your mychart to see if there is a message first and if not, then contact our office.  In helping with this matter, you help decrease call volume, and therefore allow us to be able to respond to patients needs more efficiently.   Acute office visits (sick visit):  An acute visit is intended for a new problem and are scheduled in shorter time slots to allow schedule openings for patients with new problems. This is the appropriate visit to discuss a new problem. In  order to provide you with excellent quality medical care with proper time for you to explain your problem, have an exam and receive treatment with instructions, these appointments should be limited to one new problem per visit. If you experience a new problem, in which you desire to be addressed, please make an acute office visit, we save openings on the schedule to accommodate you. Please do not save your new problem for any other type of visit, let us take care of it properly and quickly for you.   Follow up visits:  Depending on your condition(s) your provider will need to see you routinely in order to provide you with quality care and prescribe medication(s). Most chronic conditions (Example: hypertension, Diabetes, depression/anxiety... etc), require visits a couple times a year. Your provider will instruct you on proper follow up for your personal medical conditions and history. Please make certain to make follow up appointments for your condition as instructed. Failing to do so could result in lapse in your medication treatment/refills. If you request a refill, and are overdue to be seen on a condition, we will always provide you with a 30 day script (once) to allow you time to schedule.    Medicare wellness (well visit): - we have a wonderful Nurse (Kim), that will meet with you and provide you will yearly medicare wellness visits. These visits should occur yearly (can not be scheduled less than 1 calendar year apart) and cover preventive health, immunizations, advance directives and screenings you are entitled to yearly through your medicare benefits. Do   not miss out on your entitled benefits, this is when medicare will pay for these benefits to be ordered for you.  These are strongly encouraged by your provider and is the appropriate type of visit to make certain you are up to date with all preventive health benefits. If you have not had your medicare wellness exam in the last 12 months, please make  certain to schedule one by calling the office and schedule your medicare wellness with Kim as soon as possible.   Yearly physical (well visit):  - Adults are recommended to be seen yearly for physicals. Check with your insurance and date of your last physical, most insurances require one calendar year between physicals. Physicals include all preventive health topics, screenings, medical exam and labs that are appropriate for gender/age and history. You may have fasting labs needed at this visit. This is a well visit (not a sick visit), acute topics should not be covered during this visit.  - Pediatric patients are seen more frequently when they are younger. Your provider will advise you on well child visit timing that is appropriate for your their age. - This is not a medicare wellness visit. Medicare wellness exams do not have an exam portion to the visit. Some medicare companies allow for a physical, some do not allow a yearly physical. If your medicare allows a yearly physical you can schedule the medicare wellness with our nurse Kim and have your physical with your provider after, on the same day. Please check with insurance for your full benefits.   Late Policy/No Shows:  - all new patients should arrive 15-30 minutes earlier than appointment to allow us time  to  obtain all personal demographics,  insurance information and for you to complete office paperwork. - All established patients should arrive 10-15 minutes earlier than appointment time to update all information and be checked in .  - In our best efforts to run on time, if you are late for your appointment you will be asked to either reschedule or if able, we will work you back into the schedule. There will be a wait time to work you back in the schedule,  depending on availability.  - If you are unable to make it to your appointment as scheduled, please call 24 hours ahead of time to allow us to fill the time slot with someone else who needs  to be seen. If you do not cancel your appointment ahead of time, you may be charged a no show fee.    

## 2016-08-07 NOTE — Progress Notes (Signed)
Patient ID: Mary Warren, female  DOB: 19-Jul-1965, 51 y.o.   MRN: [id number] Patient Care Team    Relationship Specialty Notifications Start End  Ma Hillock, DO PCP - General Family Medicine  07/02/15     Subjective:  Mary Warren is a 51 y.o.  Female  present for CPE. All past medical history, surgical history, allergies, family history, immunizations, medications and social history were updated in the electronic medical record today. All recent labs, ED visits and hospitalizations within the last year were reviewed.  Well women exam: Pt states her cycles are still normal length and time. Alternating between heavy and light Every other month. Significant medical history for Fhx of ovarian cancer in mom at < 40 years old. She denies any dyspareunia, vaginal discharge, bloating or pelvic pain. Patient performs self breast exams.Patient's last menstrual period was 07/28/2016.  Health maintenance: Updated 08/07/2016. Colonoscopy: No FHX, routine screen 51 Mammogram: FH in Pat aunt (60), Ovarian cancer (<30), pts  mammogram normal 09/2015, pt has "reservations of radiation exposure". Still encouraged yearly mammograms giving family history. Cervical cancer screening: Ovarian cancer in mother. Pap smear 2017 normal with negative HPV by Dr. Raoul Pitch. Discussed ovarian cancer screening giving her mother's history of ovarian cancer at a young age, would put her at higher risk. Immunizations: Up-to-date with tetanus shot 2012, declines flu vaccination. Infectious disease screening: HIV completed 2012 DEXA: N/A Assistive device: none Oxygen IDH:WYSH Patient has a Dental home. Hospitalizations/ED visits: none  Depression screen [facility] 2/9 08/07/2016 09/07/2015  Decreased Interest 0 0  Down, Depressed, Hopeless 0 0  PHQ - 2 Score 0 0    Current Exercise Habits: The patient does not participate in regular exercise at present Exercise limited by: None identified    There is no immunization  history on file for this patient.   History reviewed. No pertinent past medical history. No Known Allergies Past Surgical History:  Procedure Laterality Date  . ADENOIDECTOMY     Family History  Problem Relation Age of Onset  . Drug abuse Mother   . COPD Mother   . Mental retardation Mother   . Early death Mother 68    blood clot  . Ovarian cancer Mother   . Heart disease Mother   . Breast cancer Paternal Aunt   . Alcohol abuse Sister    Social History   Social History  . Marital status: Married    Spouse name: N/A  . Number of children: N/A  . Years of education: N/A   Occupational History  . Not on file.   Social History Main Topics  . Smoking status: Never Smoker  . Smokeless tobacco: Never Used  . Alcohol use 1.2 - 1.8 oz/week    2 - 3 Glasses of wine per week  . Drug use: No  . Sexual activity: Yes    Birth control/ protection: None   Other Topics Concern  . 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   Exercises but nor routinely.    Smoke detector in the home.   Wears her set belt.   Feels safe in her relationships.       Allergies as of 08/07/2016   No Known Allergies     Medication List       Accurate as of 08/07/16  7:04 PM. Always use your most recent med list.  cholecalciferol 1000 units tablet Commonly known as:  VITAMIN D Take 2,000 Units by mouth daily.   multivitamin tablet Take 1 tablet by mouth daily.   Omega 3 1000 MG Caps Take by mouth.   PROBIOTIC ACIDOPHILUS Caps Take by mouth.   vitamin B-12 1000 MCG tablet Commonly known as:  CYANOCOBALAMIN Take 1,000 mcg by mouth daily.        Recent Results (from the past 2160 hour(s))  CBC w/Diff     Status: None   Collection Time: 08/07/16  9:08 AM  Result Value Ref Range   WBC 6.5 4.0 - 10.5 K/uL   RBC 4.03 3.87 - 5.11 Mil/uL   Hemoglobin 12.7 12.0 - 15.0 g/dL   HCT 38.1 36.0 - 46.0 %   MCV  94.4 78.0 - 100.0 fl   MCHC 33.4 30.0 - 36.0 g/dL   RDW 13.4 11.5 - 15.5 %   Platelets 272.0 150.0 - 400.0 K/uL   Neutrophils Relative % 70.0 43.0 - 77.0 %   Lymphocytes Relative 23.8 12.0 - 46.0 %   Monocytes Relative 5.2 3.0 - 12.0 %   Eosinophils Relative 0.4 0.0 - 5.0 %   Basophils Relative 0.6 0.0 - 3.0 %   Neutro Abs 4.5 1.4 - 7.7 K/uL   Lymphs Abs 1.5 0.7 - 4.0 K/uL   Monocytes Absolute 0.3 0.1 - 1.0 K/uL   Eosinophils Absolute 0.0 0.0 - 0.7 K/uL   Basophils Absolute 0.0 0.0 - 0.1 K/uL  TSH     Status: None   Collection Time: 08/07/16  9:08 AM  Result Value Ref Range   TSH 2.23 0.35 - 4.50 uIU/mL  Lipid panel     Status: Abnormal   Collection Time: 08/07/16  9:08 AM  Result Value Ref Range   Cholesterol 185 0 - 200 mg/dL    Comment: ATP III Classification       Desirable:  < 200 mg/dL               Borderline High:  200 - 239 mg/dL          High:  > = 240 mg/dL   Triglycerides 86.0 0.0 - 149.0 mg/dL    Comment: Normal:  <150 mg/dLBorderline High:  150 - 199 mg/dL   HDL 61.70 >39.00 mg/dL   VLDL 17.2 0.0 - 40.0 mg/dL   LDL Cholesterol 106 (H) 0 - 99 mg/dL   Total CHOL/HDL Ratio 3     Comment:                Men          Women1/2 Average Risk     3.4          3.3Average Risk          5.0          4.42X Average Risk          9.6          7.13X Average Risk          15.0          11.0                       NonHDL 123.50     Comment: NOTE:  Non-HDL goal should be 30 mg/dL higher than patient's LDL goal (i.e. LDL goal of < 70 mg/dL, would have non-HDL goal of < 100 mg/dL)    Mm Screening Breast Tomo Bilateral  Result Date:  10/15/2015 CLINICAL DATA:  Screening. EXAM: 2D DIGITAL SCREENING BILATERAL MAMMOGRAM WITH CAD AND ADJUNCT TOMO COMPARISON:  None. ACR Breast Density Category b: There are scattered areas of fibroglandular density. FINDINGS: There are no findings suspicious for malignancy. Images were processed with CAD. IMPRESSION: No mammographic evidence of malignancy. A  result letter of this screening mammogram will be mailed directly to the patient. RECOMMENDATION: Screening mammogram in one year. (Code:SM-B-01Y) BI-RADS CATEGORY  1: Negative. Electronically Signed   By: Lillia Mountain M.D.   On: 10/18/2015 13:21   ROS: 14 pt review of systems performed and negative (unless mentioned in an HPI)  Objective: BP 106/64 (BP Location: Right Arm, Patient Position: Sitting, Cuff Size: Normal)   Pulse 67   Temp 98.1 F (36.7 C)   Resp 20   Ht 5\' 6"  (1.676 m)   Wt 143 lb 8 oz (65.1 kg)   LMP 07/28/2016   SpO2 97%   BMI 23.16 kg/m  Gen: Afebrile. No acute distress. Nontoxic in appearance, well-developed, well-nourished,  pleasant Caucasian female. HENT: AT. [place]. Bilateral TM visualized and normal in appearance, normal external auditory canal. MMM, no oral lesions, adequate dentition. Bilateral nares within normal limits. Throat without erythema, ulcerations or exudates. No Cough on exam, no hoarseness on exam. Eyes:Pupils Equal Round Reactive to light, Extraocular movements intact,  Conjunctiva without redness, discharge or icterus. Neck/lymp/endocrine: Supple, no lymphadenopathy, no thyromegaly CV: RRR no murmur, no edema, +2/4 P posterior tibialis pulses. No carotid bruits. No JVD. Chest: CTAB, no wheeze, rhonchi or crackles. Normal Respiratory effort. Good Air movement. Abd: Soft. Flat. NTND. BS present. No Masses palpated. No hepatosplenomegaly. No rebound tenderness or guarding. Skin: No rashes, purpura or petechiae. Warm and well-perfused. Skin intact. Neuro/Msk:  Normal gait. PERLA. EOMi. Alert. Oriented x3.  Cranial nerves II through XII intact. Muscle strength 5/5 upper/lower extremity. DTRs equal bilaterally. Psych: Normal affect, dress and demeanor. Normal speech. Normal thought content and judgment.   Assessment/plan: Chrishana Spargur is a 51 y.o. female present for CPE. Encounter for screening mammogram for breast cancer - Encourage patient to continue to  have routine yearly exams. She is not due until the end of June 2018 for repeat. Patient has a family history of ovarian cancer in her mother and breast cancer in her paternal aunt. Encounter for preventive health examination Patient was encouraged to exercise greater than 150 minutes a week. Patient was encouraged to choose a diet filled with fresh fruits and vegetables, and lean meats. AVS provided to patient today for education/recommendation on gender specific health and safety maintenance. - Immunizations up-to-date - Mammogram order placed to occur at the end of June. - Encouraged ovarian cancer screening.  - PAP UTD - Infectious disease screening up-to-date  High risk of ovarian cancer - Patient is at high risk for ovarian cancer with her mother having ovarian cancer when she was younger than 50 years of age. Patient has not had prior screening for ovarian cancer. She is uncertain of her mother was positive for genetic markers. She is going to speak with her sister who seems to have more information on the subject. Patient had a normal Pap last year. Discussed with her that routine pelvic exams are important. She should also be screened keenly for ovarian cancer. Would like to start screening as soon as possible with transvaginal ultrasound and CA-125. Patient will like to speak with her sister first, and her insurance company prior to initiating screening. Patient can call in and this will be  ordered for her. - Could also consider genetic counseling referral. - COMPLETE METABOLIC PANEL WITH GFR Screening for deficiency anemia - CBC w/Diff - COMPLETE METABOLIC PANEL WITH GFR Thyroid disorder screen - COMPLETE METABOLIC PANEL WITH GFR - TSH Diabetes mellitus screening - Hemoglobin A1c Elevated LDL cholesterol level - COMPLETE METABOLIC PANEL WITH GFR - Lipid panel  Return in about 1 year (around 08/07/2017) for CPE.  Electronically signed by: Howard Pouch, DO [facility]

## 2016-08-08 ENCOUNTER — Encounter: Payer: Self-pay | Admitting: *Deleted

## 2016-08-08 ENCOUNTER — Telehealth: Payer: Self-pay | Admitting: Family Medicine

## 2016-08-08 LAB — HEMOGLOBIN A1C
Hgb A1c MFr Bld: 4.9 % (ref <5.7)
MEAN PLASMA GLUCOSE: 94 mg/dL

## 2016-08-08 NOTE — Telephone Encounter (Signed)
Left message for patient to check MY Chart for information message sent. Transvaginal US cpt code [phone] and CA-125 cpt code [phone] Dx code Z91.89.

## 2016-08-08 NOTE — Telephone Encounter (Signed)
Patient states she was seen in the office yesterday and was instructed by Dr. Raoul Pitch to contact her insurance company to confirm if the testing she was considering would be covered by insurance.  Patient states she has contacted her insurance company and was advised they will need the cpt code in order to verify if tests are covered.  Patient wants to know if there is a procedure code for high risk vs not high risk for the marker related to the tests discussed.

## 2016-08-08 NOTE — Telephone Encounter (Signed)
Please call Mary Warren: All of her labs look great.  Remind her to let us know what she finds out about her insurance covering the [place] and CA-125 lab for her ovarian cancer screening. I will place the orders when she decides she would like to have them.

## 2016-08-08 NOTE — Telephone Encounter (Signed)
Patient notified and verbalized understanding.  Patient will cal back when she hears from insurance about remaining tests.

## 2016-08-08 NOTE — Telephone Encounter (Signed)
Code is in her chart, High risk of Ovarian Cancer- Z91.89   - Normal screen includes  transvaginal Ultrasound and Ca-125 tumor marker

## 2016-08-09 ENCOUNTER — Telehealth: Payer: Self-pay | Admitting: Family Medicine

## 2016-08-09 NOTE — Telephone Encounter (Signed)
Patient LMOM at front desk stating she is returning call from [place].  Please contact patient on cell.

## 2016-08-09 NOTE — Telephone Encounter (Signed)
Patient checked with insurance. They will cover the [place] however she will have to meet her deductible. Please call her.

## 2016-08-10 NOTE — Telephone Encounter (Signed)
Patient contacted through [facility] Chart.

## 2016-08-11 ENCOUNTER — Encounter: Payer: Self-pay | Admitting: *Deleted

## 2016-08-11 ENCOUNTER — Telehealth: Payer: Self-pay | Admitting: Family Medicine

## 2016-08-11 DIAGNOSIS — Z124 Encounter for screening for malignant neoplasm of cervix: Secondary | ICD-10-CM

## 2016-08-11 DIAGNOSIS — Z9189 Other specified personal risk factors, not elsewhere classified: Secondary | ICD-10-CM

## 2016-08-11 NOTE — Telephone Encounter (Signed)
Sent patient information in MY Chart.

## 2016-08-11 NOTE — Addendum Note (Signed)
Addended by: Leota Jacobsen on: 08/11/2016 03:43 PM   Modules accepted: Orders

## 2016-08-11 NOTE — Telephone Encounter (Signed)
Pt would like to proceed with ovarian cancer screening since she is high risk. Pt is at high risk of ovarian cancer secondary to her mother had ovarian cancer onset < 50 years old.  - ordered CA125 and transvaginal US. Pt aware there may be out of pocket cost, depending on her insurance, for these tests.  - if she desires we could also refer her to genetic counseling if she would like to discuss in more detail .

## 2016-08-28 ENCOUNTER — Other Ambulatory Visit (HOSPITAL_BASED_OUTPATIENT_CLINIC_OR_DEPARTMENT_OTHER): Payer: 59

## 2016-08-28 ENCOUNTER — Ambulatory Visit (HOSPITAL_BASED_OUTPATIENT_CLINIC_OR_DEPARTMENT_OTHER): Payer: 59

## 2016-08-28 ENCOUNTER — Ambulatory Visit (HOSPITAL_BASED_OUTPATIENT_CLINIC_OR_DEPARTMENT_OTHER): Admission: RE | Admit: 2016-08-28 | Payer: 59 | Source: Ambulatory Visit

## 2016-09-04 ENCOUNTER — Ambulatory Visit (HOSPITAL_BASED_OUTPATIENT_CLINIC_OR_DEPARTMENT_OTHER)
Admission: RE | Admit: 2016-09-04 | Discharge: 2016-09-04 | Disposition: A | Discharge status: Home / Self Care | Payer: 59 | Source: Ambulatory Visit | Attending: Family Medicine | Admitting: Family Medicine

## 2016-09-04 DIAGNOSIS — D259 Leiomyoma of uterus, unspecified: Secondary | ICD-10-CM | POA: Diagnosis not present

## 2016-09-04 DIAGNOSIS — Z9189 Other specified personal risk factors, not elsewhere classified: Secondary | ICD-10-CM

## 2016-09-04 DIAGNOSIS — R938 Abnormal findings on diagnostic imaging of other specified body structures: Secondary | ICD-10-CM | POA: Diagnosis not present

## 2016-09-04 DIAGNOSIS — Z124 Encounter for screening for malignant neoplasm of cervix: Secondary | ICD-10-CM | POA: Diagnosis not present

## 2016-09-06 ENCOUNTER — Telehealth: Payer: Self-pay | Admitting: Family Medicine

## 2016-09-06 ENCOUNTER — Other Ambulatory Visit: Payer: 59

## 2016-09-06 NOTE — Telephone Encounter (Signed)
Patient scheduled to come in tomorrow for labs.  

## 2016-09-06 NOTE — Telephone Encounter (Signed)
Please contact pt and ask if she is planning on getting the lab work for her ovarian cancer screen? I do not see this has been collected.

## 2016-09-07 ENCOUNTER — Other Ambulatory Visit: Payer: 59

## 2016-09-08 ENCOUNTER — Telehealth: Payer: Self-pay | Admitting: Family Medicine

## 2016-09-08 DIAGNOSIS — N801 Endometriosis of ovary: Secondary | ICD-10-CM

## 2016-09-08 DIAGNOSIS — N80129 Deep endometriosis of ovary, unspecified ovary: Secondary | ICD-10-CM

## 2016-09-08 DIAGNOSIS — Z9189 Other specified personal risk factors, not elsewhere classified: Secondary | ICD-10-CM

## 2016-09-08 NOTE — Telephone Encounter (Signed)
Left message for patient to return call to review [place] results.

## 2016-09-08 NOTE — Telephone Encounter (Signed)
Please call patient: -I was attempting to wait to discuss ultrasound results with her after she had her CA-125 lab collected to complete her ovarian cancer screening secondary to her mother's history of ovarian cancer at a young age. However patient is now not coming until next week to have her CA-125 completed. - Please inform patient that she has a small endometrioma, also called chocolate cyst, in her right ovary. These are typically not cancerous and are caused from endometriosis. However, they do have a very small chance of transforming into ovarian cancer over time and should be monitored by ultrasound routinely.  - Given this ultrasound result and her mother's history, I would like to refer her to gynecology to continue following her for this condition. - Current recommendations without the lab work would be to repeat this ultrasound in 3 months. - I would still like her to have the lab collected to be complete. - Does she have a gynecologist she would like referral to? I will place a referral today for her.

## 2016-09-11 ENCOUNTER — Encounter: Payer: Self-pay | Admitting: *Deleted

## 2016-09-11 NOTE — Telephone Encounter (Signed)
Sent message in My Chart awaiting reply from patient.

## 2016-09-12 NOTE — Telephone Encounter (Signed)
Referred to GYN

## 2016-09-12 NOTE — Telephone Encounter (Signed)
Reviewed [place] results with patient she will be coming in for labs tomorrow. She wants a Sales promotion account executive,. She doesn't have a current Gyn.

## 2016-09-12 NOTE — Telephone Encounter (Signed)
Left message for patient to return call to review results. 

## 2016-09-13 ENCOUNTER — Other Ambulatory Visit (INDEPENDENT_AMBULATORY_CARE_PROVIDER_SITE_OTHER): Payer: 59

## 2016-09-13 DIAGNOSIS — Z9189 Other specified personal risk factors, not elsewhere classified: Secondary | ICD-10-CM | POA: Diagnosis not present

## 2016-09-14 ENCOUNTER — Telehealth: Payer: Self-pay | Admitting: Family Medicine

## 2016-09-14 LAB — CA 125: CA 125: 62 U/mL — ABNORMAL HIGH (ref <35)

## 2016-09-14 NOTE — Telephone Encounter (Signed)
Please call pt: - her CA125 is elevated to 62 (normal <35) . Please reassure pt, that although this is used to screen for ovarian cancer in high risk women, this does not mean she definitely has ovarian cancer. [place] suggested endometrioma, which as we discussed are mostly benign, but a small percentage can transition to Ovarian cancer  over time. Levels are elevated sometimes even with tumors of the ovary that are not cancerous and even small fluctuations in women during specific times of their cycles.  - what this does mean is we need her to see the gynecologist, as we suggested, to follow and discuss further management.  - Please make sure she has an appt scheduled ASAP and forward the results of [place], CA125 to that office.   FYI: FHx of ovarian cancer in her mother < 39 years of age.

## 2016-09-14 NOTE — Telephone Encounter (Signed)
Spoke with patient reviewed lab results and information with patient she states she has an appt with Gynocology on 09/27/16. Patient verbalized understanding of all results and instructions.

## 2016-09-14 NOTE — Telephone Encounter (Signed)
Patient is referred to GYN that is in [facility]

## 2016-09-27 ENCOUNTER — Encounter: Payer: Self-pay | Admitting: Obstetrics and Gynecology

## 2016-09-27 ENCOUNTER — Ambulatory Visit (INDEPENDENT_AMBULATORY_CARE_PROVIDER_SITE_OTHER): Payer: 59 | Admitting: Obstetrics and Gynecology

## 2016-09-27 VITALS — BP 148/84 | HR 84 | Resp 16 | Ht 66.0 in | Wt 147.0 lb

## 2016-09-27 DIAGNOSIS — R971 Elevated cancer antigen 125 [CA 125]: Secondary | ICD-10-CM

## 2016-09-27 DIAGNOSIS — N83201 Unspecified ovarian cyst, right side: Secondary | ICD-10-CM | POA: Diagnosis not present

## 2016-09-27 DIAGNOSIS — Z8041 Family history of malignant neoplasm of ovary: Secondary | ICD-10-CM | POA: Diagnosis not present

## 2016-09-27 DIAGNOSIS — D259 Leiomyoma of uterus, unspecified: Secondary | ICD-10-CM

## 2016-09-27 NOTE — Progress Notes (Signed)
51 y.o. G0P0000 MarriedCaucasianF here referred by Dr Howard Pouch for evaluation of an ovarian cyst and elevated CA 125. The patient's mother had ovarian cancer. Dr Raoul Pitch sent the patient for an ultrasound that showed a  2.9 cm hypoechoic area within the right ovary. She also had multiple small fibroids. The appearancesuggests endometrioma or hemorrhagic cyst. CA 125 returned at 71 .   Period Cycle (Days): 28 Period Duration (Days): 4-5 days  Period Pattern: Regular Menstrual Flow: Light, Heavy Menstrual Control: Tampon Menstrual Control Change Freq (Hours): changes tampon every 3-4 hours  Dysmenorrhea: (!) Moderate Dysmenorrhea Symptoms: Cramping, Headache  She can saturate a super tampon in up to an hour at times. Cramps are tolerable. She has had a normal CBC and TSH in the last 2 months.  Sexually active, no pain. Vasectomy for contraception. May have some night sweats, tolerable. No hot flashes, no vaginal dryness.   Patient's last menstrual period was 08/24/2016.          Sexually active: Yes.    The current method of family planning is none.    Exercising: No.  The patient does not participate in regular exercise at present. Smoker:  no  Health Maintenance: Pap:  09-07-15 WNL NEG HR HPV History of abnormal Pap:  Yes years ago- repeat PAP normal  MMG:  10-15-15 WNL  Colonoscopy:  Never BMD:   Never TDaP:  Unsure  Gardasil: N/A   reports that she has never smoked. She has never used smokeless tobacco. She reports that she drinks about 1.2 - 1.8 oz of alcohol per week . She reports that she does not use drugs. She is a IT trainer. No children, no h/o infertility.   Past Medical History:  Diagnosis Date  . Ovarian cyst     Past Surgical History:  Procedure Laterality Date  . ADENOIDECTOMY      Current Outpatient Prescriptions  Medication Sig Dispense Refill  . cholecalciferol (VITAMIN D) 1000 units tablet Take 2,000 Units by mouth daily.    . Lactobacillus (PROBIOTIC  ACIDOPHILUS) CAPS Take by mouth.    . Multiple Vitamin (MULTIVITAMIN) tablet Take 1 tablet by mouth daily.    . Omega 3 1000 MG CAPS Take by mouth.    . vitamin B-12 (CYANOCOBALAMIN) 1000 MCG tablet Take 1,000 mcg by mouth daily.     No current facility-administered medications for this visit.     Family History  Problem Relation Age of Onset  . Drug abuse Mother   . COPD Mother   . Mental retardation Mother   . Early death Mother 39       blood clot  . Ovarian cancer Mother   . Heart disease Mother   . Breast cancer Paternal Aunt   . Alcohol abuse Sister   Mom died in 08/07/92, she had ovarian cancer in her late 22's. She had a hysterectomy, no chemotherapy. Found very early. She died of COPD. No other known cancer on her Mother's side. Mom died at 62, MGM died at 54 of MI, MGF died at 58.   Review of Systems  Constitutional: Negative.   HENT: Negative.   Eyes: Negative.   Respiratory: Negative.   Cardiovascular: Negative.   Gastrointestinal: Negative.   Endocrine: Negative.   Genitourinary: Negative.   Musculoskeletal: Negative.   Skin: Negative.   Allergic/Immunologic: Negative.   Neurological: Negative.   Psychiatric/Behavioral: Negative.   She has a BM every 1-3 days (better than it was)  Exam:   BP Marland Kitchen)  148/84 (BP Location: Right Arm, Patient Position: Sitting, Cuff Size: Normal)   Pulse 84   Resp 16   Ht 5\' 6"  (1.676 m)   Wt 147 lb (66.7 kg)   LMP 08/24/2016   BMI 23.73 kg/m   Weight change: @WEIGHTCHANGE @ Height:   Height: 5\' 6"  (167.6 cm)  Ht Readings from Last 3 Encounters:  09/27/16 5\' 6"  (1.676 m)  08/07/16 5\' 6"  (1.676 m)  09/07/15 5\' 6"  (1.676 m)    General appearance: alert, cooperative and appears stated age Head: Normocephalic, without obvious abnormality, atraumatic Neck: no adenopathy, supple, symmetrical, trachea midline and thyroid normal to inspection and palpation Lungs: clear to auscultation bilaterally Cardiovascular: regular rate and  rhythm Abdomen: soft, non-tender; bowel sounds normal; no masses,  no organomegaly Extremities: extremities normal, atraumatic, no cyanosis or edema Skin: Skin color, texture, turgor normal. No rashes or lesions Lymph nodes: Cervical, supraclavicular, and axillary nodes normal. No abnormal inguinal nodes palpated Neurologic: Grossly normal   Pelvic: External genitalia:  no lesions              Urethra:  normal appearing urethra with no masses, tenderness or lesions              Bartholins and Skenes: normal                 Vagina: normal appearing vagina with normal color and discharge, no lesions              Cervix: no lesions               Bimanual Exam:  Uterus:  enlarged, 8 weeks size, irregular and retroverted              Adnexa: no mass, fullness, tenderness               Rectovaginal: Confirms               Anus:  normal sphincter tone, no lesions No uterosacral nodularity noted  Chaperone was present for exam.  Ultrasound report and images from 5/15 reviewed  A:  Family history of ovarian cancer in the patient's mother in her late 20's  2.9 cm complex right ovarian cyst, suspect endometrioma, possible hemorrhagic cyst. No signs on ultrasound concerning for ovarian cancer  Fibroid uterus   Elevated CA 125 of 62 (patient is pre-menopausal, endometriosis and fibroids could cause elevation of the CA 125)  P:   Send for Genetic counseling, discussed genetic testing  Discussed options of observation or surgery (could range from cystectomy to BSO to TLH/BSO)  Will f/u after Genetic counseling  Will get a f/u ultrasound in 3 more weeks (6 weeks after original u/s to f/u on the ovarian cyst)  Will likely repeat her CA 125 at that visit   CC: Dr Howard Pouch Letter sent

## 2016-09-27 NOTE — Patient Instructions (Signed)
Endometriosis Endometriosis is a condition in which the tissue that lines the uterus (endometrium) grows outside of its normal location. The tissue may grow in many locations close to the uterus, but it commonly grows on the ovaries, fallopian tubes, vagina, or bowel. When the uterus sheds the endometrium every menstrual cycle, there is bleeding wherever the endometrial tissue is located. This can cause pain because blood is irritating to tissues that are not normally exposed to it. What are the causes? The cause of endometriosis is not known. What increases the risk? You may be more likely to develop endometriosis if you:  Have a family history of endometriosis.  Have never given birth.  Started your period at age 10 or younger.  Have high levels of estrogen in your body.  Were exposed to a certain medicine (diethylstilbestrol) before you were born (in utero).  Had low birth weight.  Were born as a twin, triplet, or other multiple.  Have a BMI of less than 25. BMI is an estimate of body fat and is calculated from height and weight.  What are the signs or symptoms? Often, there are no symptoms of this condition. If you do have symptoms, they may:  Vary depending on where your endometrial tissue is growing.  Occur during your menstrual period (most common) or midcycle.  Come and go, or you may go months with no symptoms at all.  Stop with menopause.  Symptoms may include:  Pain in the back or abdomen.  Heavier bleeding during periods.  Pain during sex.  Painful bowel movements.  Infertility.  Pelvic pain.  Bleeding more than once a month.  How is this diagnosed? This condition is diagnosed based on your symptoms and a physical exam. You may have tests, such as:  Blood tests and urine tests. These may be done to help rule out other possible causes of your symptoms.  Ultrasound, to look for abnormal tissues.  An X-ray of the lower bowel (barium enema).  An  ultrasound that is done through the vagina (transvaginally).  CT scan.  MRI.  Laparoscopy. In this procedure, a lighted, pencil-sized instrument called a laparoscope is inserted into your abdomen through an incision. The laparoscope allows your health care provider to look at the organs inside your body and check for abnormal tissue to confirm the diagnosis. If abnormal tissue is found, your health care provider may remove a small piece of tissue (biopsy) to be examined under a microscope.  How is this treated? Treatment for this condition may include:  Medicines to relieve pain, such as NSAIDs.  Hormone therapy. This involves using artificial (synthetic) hormones to reduce endometrial tissue growth. Your health care provider may recommend using a hormonal form of birth control, or other medicines.  Surgery. This may be done to remove abnormal endometrial tissue. ? In some cases, tissue may be removed using a laparoscope and a laser (laparoscopic laser treatment). ? In severe cases, surgery may be done to remove the fallopian tubes, uterus, and ovaries (hysterectomy).  Follow these instructions at home:  Take over-the-counter and prescription medicines only as told by your health care provider.  Do not drive or use heavy machinery while taking prescription pain medicine.  Try to avoid activities that cause pain, including sexual activity.  Keep all follow-up visits as told by your health care provider. This is important. Contact a health care provider if:  You have pain in the area between your hip bones (pelvic area) that occurs: ? Before, during, or   after your period. ? In between your period and gets worse during your period. ? During or after sex. ? With bowel movements or urination, especially during your period.  You have problems getting pregnant.  You have a fever. Get help right away if:  You have severe pain that does not get better with medicine.  You have severe  nausea and vomiting, or you cannot eat without vomiting.  You have pain that affects only the lower, right side of your abdomen.  You have abdominal pain that gets worse.  You have abdominal swelling.  You have blood in your stool. This information is not intended to replace advice given to you by your health care provider. Make sure you discuss any questions you have with your health care provider. Document Released: 04/07/2000 Document Revised: 01/14/2016 Document Reviewed: 09/11/2015 Elsevier Interactive Patient Education  2018 [employer]vier Inc.  

## 2016-10-04 ENCOUNTER — Ambulatory Visit: Payer: 59 | Admitting: Obstetrics and Gynecology

## 2016-10-23 ENCOUNTER — Telehealth: Payer: Self-pay | Admitting: Genetics

## 2016-10-23 ENCOUNTER — Telehealth: Payer: Self-pay | Admitting: Obstetrics and Gynecology

## 2016-10-23 NOTE — Telephone Encounter (Signed)
I think she needs to be seen by the genetic center irregardless of her f/u ultrasound. It will help [place] with long term plans and recommendations.

## 2016-10-23 NOTE — Telephone Encounter (Signed)
Patients referral for genetics entered on 09-28-16. Multiple attempts to schedule with [facility] with no appointment to date. Patient states there are other tests pending the outcome of the genetics appointment. Call to Johnnette Gourd, Practice Administrator of [facility], to discuss. Left voicemail for return call.  Forwarding to Dr. Talbert Nan to advise regarding an ultrasound the patient stated was pending her genetics appointment. She is asking if she should move forward with an ultrasound or continue to wait on genetics.

## 2016-10-23 NOTE — Telephone Encounter (Signed)
Genetic counseling appt scheduled for the pt on 7/5 at 1pm. Referring office will notify the pt.

## 2016-10-23 NOTE — Telephone Encounter (Signed)
Call to [place] with Mary Warren - appointment scheduled 10-26-16 @ 1pm. Called to notify patient. She is aware.   Please advise regarding recommended ultrasound from last office note.

## 2016-10-26 ENCOUNTER — Ambulatory Visit (HOSPITAL_BASED_OUTPATIENT_CLINIC_OR_DEPARTMENT_OTHER): Payer: 59 | Admitting: Genetics

## 2016-10-26 ENCOUNTER — Encounter: Payer: Self-pay | Admitting: Genetics

## 2016-10-26 ENCOUNTER — Other Ambulatory Visit: Payer: 59

## 2016-10-26 DIAGNOSIS — Z803 Family history of malignant neoplasm of breast: Secondary | ICD-10-CM

## 2016-10-26 DIAGNOSIS — Z8049 Family history of malignant neoplasm of other genital organs: Secondary | ICD-10-CM | POA: Diagnosis not present

## 2016-10-26 DIAGNOSIS — R971 Elevated cancer antigen 125 [CA 125]: Secondary | ICD-10-CM | POA: Diagnosis not present

## 2016-10-26 DIAGNOSIS — N801 Endometriosis of ovary: Secondary | ICD-10-CM | POA: Diagnosis not present

## 2016-10-26 DIAGNOSIS — N80129 Deep endometriosis of ovary, unspecified ovary: Secondary | ICD-10-CM

## 2016-10-26 DIAGNOSIS — Z8041 Family history of malignant neoplasm of ovary: Secondary | ICD-10-CM | POA: Diagnosis not present

## 2016-10-26 DIAGNOSIS — Z315 Encounter for genetic counseling: Secondary | ICD-10-CM | POA: Diagnosis not present

## 2016-10-26 NOTE — Progress Notes (Signed)
REFERRING PROVIDER: Salvadore Dom, [address] [place], [place] [address]  PRIMARY PROVIDER:  Howard Pouch A, DO  PRIMARY REASON FOR VISIT:  1. Family history of ovarian cancer   2. Endometrioma of ovary      HISTORY OF PRESENT ILLNESS:   Mary Warren, a 51 y.o. female, was seen for a [facility] cancer genetics consultation at the request of Dr. Talbert Nan due to a family history of ovarian cancer.  Mary Warren presents to clinic today to discuss the possibility of a hereditary predisposition to cancer, genetic testing, and to further clarify her future cancer risks, as well as potential cancer risks for family members.    Mary Warren is a 51 y.o. female with no personal history of cancer.  She recently had a ultrasound that revealed an ovarian cyst on the right ovary.  Her C-125 was elevated.  This cyst is being monitored.    HORMONAL RISK FACTORS:  Menarche was at age 35.  First live birth at age N/A.  OCP use for approximately 15 years.  Ovaries intact: yes.  Hysterectomy: yes.  Menopausal status: premenopausal.  HRT use: 0 years. Colonoscopy: no; not examined.  Past Medical History:  Diagnosis Date  . Family history of ovarian cancer   . Ovarian cyst     Past Surgical History:  Procedure Laterality Date  . ADENOIDECTOMY      Social History   Social History  . Marital status: Married    Spouse name: N/A  . Number of children: N/A  . Years of education: N/A   Social History Main Topics  . Smoking status: Never Smoker  . Smokeless tobacco: Never Used  . Alcohol use 1.2 - 1.8 oz/week    2 - 3 Glasses of wine per week  . Drug use: No  . Sexual activity: Yes    Partners: Male    Birth control/ protection: None   Other Topics Concern  . None   Social History Narrative   Husband, 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   Exercises but nor routinely.    Smoke detector in the home.   Wears her set belt.   Feels safe in her  relationships.         FAMILY HISTORY:  We obtained a detailed, 4-generation family history.  Significant diagnoses are listed below: Family History  Problem Relation Age of Onset  . Drug abuse Mother   . COPD Mother   . Mental retardation Mother   . Early death Mother 2       blood clot  . Ovarian cancer Mother 51       dx 'late July 18, 2022', died at 55 due to COPD  . Heart disease Mother   . Breast cancer Paternal Aunt 82       is now in 46's  . Alcohol abuse Sister   . Heart disease Maternal Uncle        died late 21's  . Heart attack Maternal Grandmother 52   Mary Warren. Buster has a 41 year-old sister who has no history of cancer.  This sister has a 74 year-old daughter (who has 2 sons ages 13 and 17).  No history of cancer for these relatives. Mary Warren has a 60 year-old brother who has a 18 year old daughter and a 62 year-old son.  No history of cancer for these relatives.  Mary Warren has a paternal half brother who is 35 and has 2  sons (both under the age of 49) with no history of cancer.   Mary Warren mother was diagnosed with ovarian cancer in her late 52's.  This was reportedly caught very early, and she survived this diagnosis.  Her mother died at 92 due to COPD.  Mary Warren has a maternal uncle who died in his late 40's/early 52's due to heart disease.  He had no children or any history of cancer. Mary Warren has a maternal aunt who is in her mid 28's and has never had a history of cancer.  This aunt has 2 daughters who are in their 5's with no history of cancer, and a son who died in his 11's due to drug related complications.   Mary Warren maternal grandmother died at 40 due to a heart attack.  She was adopted so there is no information known about this side of the family.  Mary Warren maternal grandfather died in his early 40's due to alcohol related disease.  He had several siblings, but there is limited information about this side of the family.   Mary Warren father is 31 and has  no history of cancer.  Mary Warren has 2 full paternal uncles and 2 full paternal aunts. In addition, she has 5 half paternal uncles.   -One paternal aunt was diagnosed with breast cancer in her 30's, and is now 3.  This aunt has 2 daughters and 1 son who are in their 30's and 56's.  No history of cancer for these cousins.   -One paternal aunt is in her 33's and has 3 children.  No known cancer history for these relatives.  -One paternal uncle is in his 61's and has 3 children.  No known cancer history for these relatives.  -One paternal uncle is in his 34's and has 5 children.  No known cancer history for these relatives.  -5 paternal half-uncles on the paternal grandfather's side.  Limited information no history of cancer that the patient is aware of.  Mary Warren. [place] paternal grandmother died in her late 36's early 96's due to age related illness.  No information is known about her side of the family. Mary Warren. [place] paternal grandfather died in his late 19's due to age related illness.  No information is known about his side of the family.     Mary Warren is unaware of previous family history of genetic testing for hereditary cancer risks. Patient's maternal ancestors are of English/French descent, and paternal ancestors are of English/Irish  descent. There is No reported Ashkenazi Jewish ancestry. There is No known consanguinity.  GENETIC COUNSELING ASSESSMENT: Mary Warren is a 51 y.o. female with a family history which is somewhat suggestive of a Hereditary Cancer Predisposition Syndrome and predisposition to cancer. We, therefore, discussed and recommended the following at today's visit.   DISCUSSION: We reviewed the characteristics, features and inheritance patterns of hereditary cancer syndromes. We also discussed genetic testing, including the appropriate family members to test, the process of testing, insurance coverage and turn-around-time for results. We discussed the implications of a negative,  positive and/or variant of uncertain significant result. We recommended Mary Warren pursue genetic testing for the [facility] Common Hereditary Cancer gene panel. The Hereditary Gene Panel offered by [facility] includes sequencing and/or deletion duplication testing of the following 46 genes: APC, ATM, AXIN2, BARD1, BMPR1A, BRCA1, BRCA2, BRIP1, CDH1, CDKN2A (p14ARF), CDKN2A (p16INK4a), CHEK2, CTNNA1, DICER1, EPCAM (Deletion/duplication testing only), GREM1 (promoter region deletion/duplication testing only), KIT, MEN1, MLH1, MSH2, MSH3, MSH6, MUTYH,  NBN, NF1, NHTL1, PALB2, PDGFRA, PMS2, POLD1, POLE, PTEN, RAD50, RAD51C, RAD51D, SDHB, SDHC, SDHD, SMAD4, SMARCA4. STK11, TP53, TSC1, TSC2, and VHL.  The following genes were evaluated for sequence changes only: SDHA and HOXB13 c.251G>A variant only.  We discussed abut 20-25% of ovarian cancers are associated with a Hereditary cancer predisposition syndrome.  One of the most common hereditary cancer syndromes that increases ovarian cancer risk is called Hereditary Breast and Ovarian Cancer (HBOC) syndrome.  This syndrome is caused by mutations in the BRCA1 and BRCA2 genes.  This syndrome increases an individual's lifetime risk to develop breast, ovarian, pancreatic, and other types of cancer.  There are also many other cancer predisposition syndromes caused by mutations in several other genes.  We discussed that if she is found to have a mutation in one of these genes, it may impact future medical management recommendations such as increased cancer screenings and consideration of risk reducing surgeries.  A positive result could also have implications for the patient's family members.  A Negative result would mean we were unable to identify a hereditary component to her cancer, but does not rule out the possibility of a hereditary basis for her cancer.  There could be mutations that are undetectable by current technology, or in genes not yet tested or identified to  increase cancer risk.    We discussed the potential to find a Variant of Uncertain Significance or VUS.  These are variants that have not yet been identified as pathogenic or benign, and it is unknown if this variant is associated with increased cancer risk or if this is a normal finding.  Most VUS's are reclassified to benign or likely benign.   It should not be used to make medical management decisions. With time, we suspect the lab will determine the significance of any VUS's identified if any.   Based on Mary Warren. Pearse's family history of cancer, she meets medical criteria for genetic testing. Despite that she meets criteria, she may still have an out of pocket cost. We discussed that if her out of pocket cost for testing is over $100, the laboratory will call and confirm whether she wants to proceed with testing.  If the out of pocket cost of testing is less than $100 she will be billed by the genetic testing laboratory.   PLAN: After considering the risks, benefits, and limitations, Mary Warren  provided informed consent to pursue genetic testing and the blood sample was sent to [facility] for analysis of the Common Hereditary Cancer 46 gene panel. Results should be available within approximately 2-3 weeks' time, at which point they will be disclosed by telephone to Mary Warren, as will any additional recommendations warranted by these results. Mary Warren. Sprick will receive a summary of her genetic counseling visit and a copy of her results once available. This information will also be available in Epic. We encouraged Mary Warren. Brubacher to remain in contact with cancer genetics annually so that we can continuously update the family history and inform her of any changes in cancer genetics and testing that may be of benefit for her family. Mary Warren. [place] questions were answered to her satisfaction today. Our contact information was provided should additional questions or concerns arise.  Based on Mary Warren. League's family  history of ovarian cancer, her full brother and full sister as well as her maternal aunt are eligible to have genetic counseling and genetic testing as well.  Mary Warren. Dazey will let us know if we can be of any assistance  in coordinating genetic counseling and/or testing for this family member.   Lastly, we encouraged Mary Warren. Wrench to remain in contact with cancer genetics annually so that we can continuously update the family history and inform her of any changes in cancer genetics and testing that may be of benefit for this family.   Mary Warren.  [place] questions were answered to her satisfaction today. Our contact information was provided should additional questions or concerns arise. Thank you for the referral and allowing [place] to share in the care of your patient.   Tana Felts, Mary Warren Genetic Counselor [email or web address] phone: [phone]  The patient was seen for a total of 40 minutes in face-to-face genetic counseling.

## 2016-11-03 ENCOUNTER — Other Ambulatory Visit: Payer: Self-pay | Admitting: Obstetrics and Gynecology

## 2016-11-03 DIAGNOSIS — N83201 Unspecified ovarian cyst, right side: Secondary | ICD-10-CM

## 2016-11-03 DIAGNOSIS — Z8041 Family history of malignant neoplasm of ovary: Secondary | ICD-10-CM

## 2016-11-03 DIAGNOSIS — R971 Elevated cancer antigen 125 [CA 125]: Secondary | ICD-10-CM

## 2016-11-03 NOTE — Telephone Encounter (Signed)
The patient needs a f/u ultrasound in the next 1-2 weeks, f/u for complex right ovarian cyst and elevated CA 125. Order has been placed. She has had a genetic consult and is waiting for the results.

## 2016-11-06 ENCOUNTER — Telehealth: Payer: Self-pay | Admitting: Obstetrics and Gynecology

## 2016-11-06 NOTE — Telephone Encounter (Signed)
Patient returning call.

## 2016-11-06 NOTE — Telephone Encounter (Signed)
Call placed to patient to review benefits and schedule recommended ultrasound. Left voicemail requesting a return call.

## 2016-11-06 NOTE — Telephone Encounter (Signed)
Patient returned call. Reviewed benefit for recommended ultrasound. Patient understood and agreeable. Patient ready to schedule. Patient scheduled 11/14/16 with Dr Talbert Nan. Patient aware of date, arrival time and cancellation policy. No further questions. Ok to close  Routing to Dr Talbert Nan for final review

## 2016-11-08 ENCOUNTER — Encounter: Payer: Self-pay | Admitting: Genetics

## 2016-11-08 ENCOUNTER — Telehealth: Payer: Self-pay | Admitting: Genetics

## 2016-11-08 ENCOUNTER — Ambulatory Visit: Payer: Self-pay | Admitting: Genetics

## 2016-11-08 DIAGNOSIS — Z1379 Encounter for other screening for genetic and chromosomal anomalies: Secondary | ICD-10-CM | POA: Insufficient documentation

## 2016-11-08 DIAGNOSIS — Z8041 Family history of malignant neoplasm of ovary: Secondary | ICD-10-CM

## 2016-11-08 NOTE — Telephone Encounter (Signed)
Revealed negative genetic testing.   Informed patient that a VUS in POLD1 was also identified.  Discussed that we do not know why her mother had ovarian cancer or why there is cancer in the family. It could be due to a different gene that we are not testing, or maybe our current technology may not be able to pick something up.  It will be important for her to keep in contact with genetics to keep up with whether additional testing may be needed. Informed her that her sibling and maternal relatives also meet criteria and should consider genetic counseling and genetic testing.

## 2016-11-08 NOTE — Progress Notes (Signed)
HPI: Mary Warren was previously seen in the [facility] on 10/26/2016 due to a family history of ovarian cancer and concerns regarding a hereditary predisposition to cancer. Please refer to our prior cancer genetics clinic note for more information regarding Mary Warren's medical, social and family histories, and our assessment and recommendations, at the time. Mary Warren recent genetic test results were disclosed to her, as well as recommendations warranted by these results. These results and recommendations are discussed in more detail below.  FAMILY HISTORY:  We obtained a detailed, 4-generation family history.  Significant diagnoses are listed below: Family History  Problem Relation Age of Onset  . Drug abuse Mother   . COPD Mother   . Mental retardation Mother   . Early death Mother 44       blood clot  . Ovarian cancer Mother 109       dx 'late 07/08/2022', died at 67 due to COPD  . Heart disease Mother   . Breast cancer Paternal Aunt 21       is now in 108's  . Alcohol abuse Sister   . Heart disease Maternal Uncle        died late 24's  . Heart attack Maternal Grandmother 52   Mary Warren has a 36 year-old sister who has no history of cancer.  This sister has a 34 year-old daughter (who has 2 sons ages 37 and 22).  No history of cancer for these relatives. Mary Warren has a 32 year-old brother who has a 80 year old daughter and a 90 year-old son.  No history of cancer for these relatives.  Mary Warren has a paternal half brother who is 74 and has 2 sons (both under the age of 106) with no history of cancer.   Mary Warren mother was diagnosed with ovarian cancer in her late 88's.  This was reportedly caught very early, and she survived this diagnosis.  Her mother died at 24 due to COPD.  Mary Warren has a maternal uncle who died in his late 40's/early 2's due to heart disease.  He had no children or any history of cancer. Mary Warren has a maternal aunt who is in her mid 103's and  has never had a history of cancer.  This aunt has 2 daughters who are in their 45's with no history of cancer, and a son who died in his 84's due to drug related complications.   Mary Warren maternal grandmother died at 38 due to a heart attack.  She was adopted so there is no information known about this side of the family.  Mary Warren maternal grandfather died in his early 19's due to alcohol related disease.  He had several siblings, but there is limited information about this side of the family.   Mary Warren father is 19 and has no history of cancer.  Mary Warren has 2 full paternal uncles and 2 full paternal aunts. In addition, she has 5 half paternal uncles.   -One paternal aunt was diagnosed with breast cancer in her 13's, and is now 42.  This aunt has 2 daughters and 1 son who are in their 61's and 77's.  No history of cancer for these cousins.   -One paternal aunt is in her 72's and has 3 children.  No known cancer history for these relatives.  -One paternal uncle is in his 22's and has 3 children.  No known cancer history for these relatives.  -One  paternal uncle is in his 54's and has 5 children.  No known cancer history for these relatives.  -5 paternal half-uncles on the paternal grandfather's side.  Limited information no history of cancer that the patient is aware of.  Mary Warren paternal grandmother died in her late 82's early 53's due to age related illness.  No information is known about her side of the family. Mary Warren paternal grandfather died in his late 47's due to age related illness.  No information is known about his side of the family.     Mary Warren is unaware of previous family history of genetic testing for hereditary cancer risks. Patient's maternal ancestors are of English/French descent, and paternal ancestors are of English/Irish  descent. There is No reported Ashkenazi Jewish ancestry. There is No known consanguinity.  GENETIC TEST RESULTS: Genetic testing  performed through [facility] reported out on 11/06/2016 showed no pathogenic mutations. She was tested for the [facility] Common Hereditary Cancer Panel.    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   The Hereditary Gene Panel offered by [facility] includes sequencing and/or deletion duplication testing of the following 46 genes: APC, ATM, AXIN2, BARD1, BMPR1A, BRCA1, BRCA2, BRIP1, CDH1, CDKN2A (p14ARF), CDKN2A (p16INK4a), CHEK2, CTNNA1, DICER1, EPCAM (Deletion/duplication testing only), GREM1 (promoter region deletion/duplication testing only), KIT, MEN1, MLH1, MSH2, MSH3, MSH6, MUTYH, NBN, NF1, NHTL1, PALB2, PDGFRA, PMS2, POLD1, POLE, PTEN, RAD50, RAD51C, RAD51D, SDHB, SDHC, SDHD, SMAD4, SMARCA4. STK11, TP53, TSC1, TSC2, and VHL.  The following genes were evaluated for sequence changes only: SDHA and HOXB13 c.251G>A variant only.  The test report will be scanned into EPIC and will be located under the Molecular Pathology section of the Results Review tab.A portion of the result report is included below for reference.      We discussed with Mary Warren that because current genetic testing is not perfect, it is possible there may be a gene mutation in one of these genes that current testing cannot detect, but that chance is small. We also discussed, that there could be another gene that has not yet been discovered, or that we have not yet tested, that is responsible for the cancer diagnoses in the family. Therefore, it is important to remain in touch with cancer genetics in the future so that we can continue to offer Mary Warren the most up to date genetic testing.   Regarding the VUS in POLD1: At this time, it is unknown if this variant is associated with increased cancer risk or if this is a normal finding, but most variants such as this get reclassified to being inconsequential. It should not be used to make medical management decisions. With time, we suspect  the lab will determine the significance of this variant, if any. If we do learn more about it, we will try to contact Ms. Presswood to discuss it further. However, it is important to stay in touch with [place] periodically and keep the address and phone number up to date.  ADDITIONAL GENETIC TESTING: We discussed with Ms. Piccini that there are other genes that are associated with increased cancer risk that can be analyzed. The laboratories that offer this testing look at these additional genes via a hereditary cancer gene panel. Should Ms. Deberry wish to pursue additional genetic testing, we are happy to discuss and coordinate this testing, at any time.    CANCER SCREENING RECOMMENDATIONS: Based on these negative genetic test results, there areno additional cancer risks  we are aware of for Ms. Beaird, and no additional screening or medical management that we would recommend for her at this time based on genetic testing.     This normal result is reassuring and indicates that it is unlikely Ms. Cosby has an increased risk of cancer due to a mutation in one of these genes.  However, other factors such as her personal and family history may still affect her cancer risk. Therefore, Ms. Doddridge was advised to continue following the cancer screening guidelines provided by her healthcare providers.     Based on the Ms. Hammers's personal and family history of cancer, as well as her genetic test results, statistical model Tyrer Cusik (IBIS) was  used to estimate her risk of developing breast cancer. This estimates her lifetime risk of developing breast cancer to be approximately 11.6%  The patient's lifetime breast cancer risk is a preliminary estimate based on available information using one of several models endorsed by the [employer] (ACS). The ACS recommends consideration of breast MRI screening as an adjunct to mammography for patients at high risk (defined as 20% or greater lifetime risk). A more  detailed breast cancer risk assessment can be considered, if clinically indicated.    RECOMMENDATIONS FOR FAMILY MEMBERS: Women in this family might be at some increased risk of developing cancer, over the general population risk, simply due to the family history of cancer. We recommended women in this family have a yearly mammogram beginning at age 15, or 44 years younger than the earliest onset of cancer, an annual clinical breast exam, and perform monthly breast self-exams. Women in this family should also have a gynecological exam as recommended by their primary provider. All family members should have a colonoscopy by age 62.  Based on Ms. Goetzinger's family history, we also recommended her siblings and maternal relatives, have genetic counseling and testing based on the family history of ovarian cancer. Ms. Wilz will let us know if we can be of any assistance in coordinating genetic counseling and/or testing for this family member.   FOLLOW-UP: Lastly, we discussed with Ms. Crigger that cancer genetics is a rapidly advancing field and it is possible that new genetic tests will be appropriate for her and/or her family members in the future. We encouraged her to remain in contact with cancer genetics on an annual basis so we can update her personal and family histories and let her know of advances in cancer genetics that may benefit this family.   Our contact number was provided. Ms. Osoria questions were answered to her satisfaction, and she knows she is welcome to call us at anytime with additional questions or concerns.   Ferol Luz, MS Genetic Counselor Tandi [email or web address]

## 2016-11-14 ENCOUNTER — Encounter: Payer: Self-pay | Admitting: Obstetrics and Gynecology

## 2016-11-14 ENCOUNTER — Ambulatory Visit (INDEPENDENT_AMBULATORY_CARE_PROVIDER_SITE_OTHER): Payer: 59 | Admitting: Obstetrics and Gynecology

## 2016-11-14 ENCOUNTER — Ambulatory Visit (INDEPENDENT_AMBULATORY_CARE_PROVIDER_SITE_OTHER): Payer: 59

## 2016-11-14 VITALS — BP 122/70 | HR 64 | Resp 14 | Wt 152.0 lb

## 2016-11-14 DIAGNOSIS — Z8041 Family history of malignant neoplasm of ovary: Secondary | ICD-10-CM | POA: Diagnosis not present

## 2016-11-14 DIAGNOSIS — N83201 Unspecified ovarian cyst, right side: Secondary | ICD-10-CM

## 2016-11-14 DIAGNOSIS — R971 Elevated cancer antigen 125 [CA 125]: Secondary | ICD-10-CM

## 2016-11-14 DIAGNOSIS — D259 Leiomyoma of uterus, unspecified: Secondary | ICD-10-CM | POA: Diagnosis not present

## 2016-11-14 NOTE — Progress Notes (Signed)
GYNECOLOGY  VISIT   HPI: 51 y.o.   Married  Caucasian  female   G1P0010 with Patient's last menstrual period was 11/10/2016.   here for follow up a complex right ovarian cyst and elevated CA 125 of 62. The last ultrasound looked most c/w an endometrioma or hemorrhagic cyst. + fibroids. Mother with ovarian cancer in her 73's. Patient with negative BRCA testing  GYNECOLOGIC HISTORY: Patient's last menstrual period was 11/10/2016. Contraception:none Menopausal hormone therapy: none        OB History    Gravida Para Term Preterm AB Living   1       1     SAB TAB Ectopic Multiple Live Births                     Patient Active Problem List   Diagnosis Date Noted  . Genetic testing 11/08/2016  . Family history of ovarian cancer   . 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 High risk of ovarian cancer 08/07/2016  . Encounter for preventive health examination 09/07/2015  . Skin lesion of breast 09/07/2015    Past Medical History:  Diagnosis Date  . Family history of ovarian cancer   . Ovarian cyst     Past Surgical History:  Procedure Laterality Date  . ADENOIDECTOMY      Current Outpatient Prescriptions  Medication Sig Dispense Refill  . cholecalciferol (VITAMIN D) 1000 units tablet Take 2,000 Units by mouth daily.    . Lactobacillus (PROBIOTIC ACIDOPHILUS) CAPS Take by mouth.    . Multiple Vitamin (MULTIVITAMIN) tablet Take 1 tablet by mouth daily.    . Omega 3 1000 MG CAPS Take by mouth.    . vitamin B-12 (CYANOCOBALAMIN) 1000 MCG tablet Take 1,000 mcg by mouth daily.     No current facility-administered medications for this visit.      ALLERGIES: Patient has no known allergies.  Family History  Problem Relation Age of Onset  . Drug abuse Mother   . COPD Mother   . Mental retardation Mother   . Early death Mother 58       blood clot  . Ovarian cancer Mother 37       dx 'late 2022-06-16', died at 66 due to COPD  . Heart disease Mother   . Breast cancer Paternal Aunt  29       is now in 36's  . Alcohol abuse Sister   . Heart disease Maternal Uncle        died late 72's  . Heart attack Maternal Grandmother 52    Social History   Social History  . Marital status: Married    Spouse name: N/A  . Number of children: N/A  . Years of education: N/A   Occupational History  . Not on file.   Social History Main Topics  . Smoking status: Never Smoker  . Smokeless tobacco: Never Used  . Alcohol use 1.2 - 1.8 oz/week    2 - 3 Glasses of wine per week  . Drug use: No  . Sexual activity: Yes    Partners: Male    Birth control/ protection: None   Other Topics Concern  . 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   Exercises but nor routinely.    Smoke detector in the home.   Wears her set belt.   Feels safe in her relationships.  Review of Systems  Constitutional: Negative.   HENT: Negative.   Eyes: Negative.   Respiratory: Negative.   Cardiovascular: Negative.   Gastrointestinal: Negative.   Genitourinary: Negative.   Musculoskeletal: Negative.   Skin: Negative.   Neurological: Negative.   Endo/Heme/Allergies: Negative.   Psychiatric/Behavioral: Negative.     PHYSICAL EXAMINATION:    BP 122/70 (BP Location: Right Arm, Patient Position: Sitting, Cuff Size: Normal)   Pulse 64   Resp 14   Wt 152 lb (68.9 kg)   LMP 11/10/2016   BMI 24.53 kg/m     General appearance: alert, cooperative and appears stated age   Reviewed her current and last ultrasound with the patient. The right ovarian cyst looks c/w an endometrioma or hemorrhagic cyst, stable vs slightly smaller than her last ultrasound. No features concerning for ovarian cancer.   ASSESSMENT Complex right ovarian cyst, stable or slightly smaller. Most c/w endometrioma Elevated CA 125, 62. Likely elevated from endometrioma and fibroids.  Family history of ovarian cancer in her mother in her 82's, died in  her 38's of COPD Patient with negative BRCA testing She doesn't have an elevated risk of breast cancer    PLAN Check CA 125 Discussed options of continued surveillance of ultrasound and CA 125's, laparoscopy with either BSO, BS and right ovarian cystectomy, or RSO and left salpingectomy Discussed the risks and benefits of the different options. She would like to discuss with her husband. If her CA 125 is stable, at minimum we will get another ultrasound and CA 125 in 3 months.  We discussed surgery and the risks, including the risk of surgical menopause She does understand the limitation of screening for ovarian cancer Will make further plans after her lab work returns.    An After Visit Summary was printed and given to the patient.  Over 25 minutes face to face time of which over 50% was spent in counseling.   CC: Dr Howard Pouch

## 2016-11-15 ENCOUNTER — Other Ambulatory Visit: Payer: Self-pay | Admitting: Obstetrics and Gynecology

## 2016-11-15 DIAGNOSIS — N83209 Unspecified ovarian cyst, unspecified side: Secondary | ICD-10-CM

## 2016-11-15 DIAGNOSIS — R971 Elevated cancer antigen 125 [CA 125]: Secondary | ICD-10-CM

## 2016-11-15 LAB — CA 125: CANCER ANTIGEN (CA) 125: 52.8 U/mL — AB (ref 0.0–38.1)

## 2017-02-08 ENCOUNTER — Telehealth: Payer: Self-pay | Admitting: Obstetrics and Gynecology

## 2017-02-08 NOTE — Telephone Encounter (Signed)
Call placed to patient to review benefits and to schedule three month follow up ultrasound. Patient answered the call but abruptly cuts me off asking if we could "reconvene this call at a later time", as she was in a meeting. I asked patient if she would like to call me back and patient responded, "yes" and promptly ended the call.

## 2017-02-14 NOTE — Telephone Encounter (Signed)
See previous message. Patient has not returned call to schedule follow up ultrasound. Call placed to patient to review benefits and to schedule the recommended ultrasound. Left a voicemail message requesting a return call.

## 2017-02-15 NOTE — Telephone Encounter (Signed)
Patient returned call. Reviewed benefits for recommended follow up ultrasound. Patient understood and agreeable. Patient ready to schedule. Patient scheduled 02/27/17 with Dr Talbert Nan. Patient aware of appointment date, arrival time and cancellation.   Patient states she believes she is to have labs drawn and would like to coordinate this at the scheduled appointment on 02/27/17.  Advised patient I will forward this information to our Triage nurse to review. Patient is agreeable to a return call to advise.  Routing to Dr Talbert Nan   cc: Triage Nurse

## 2017-02-15 NOTE — Telephone Encounter (Signed)
Spoke with patient who is due for 3 month follow up PUS and CA 125. Advised I have added a note to her appointment that she will need to have lab work for CA 125 the day of her appointment. Advised this will be drawn before she leaves the same day. Patient verbalizes understanding.  Routing to provider for final review. Patient agreeable to disposition. Will close encounter.

## 2017-02-27 ENCOUNTER — Other Ambulatory Visit: Payer: 59 | Admitting: Obstetrics and Gynecology

## 2017-02-27 ENCOUNTER — Other Ambulatory Visit: Payer: 59

## 2017-03-06 ENCOUNTER — Encounter: Payer: Self-pay | Admitting: Obstetrics and Gynecology

## 2017-03-06 ENCOUNTER — Ambulatory Visit: Payer: 59 | Admitting: Obstetrics and Gynecology

## 2017-03-06 ENCOUNTER — Ambulatory Visit (INDEPENDENT_AMBULATORY_CARE_PROVIDER_SITE_OTHER): Payer: 59

## 2017-03-06 ENCOUNTER — Other Ambulatory Visit: Payer: Self-pay

## 2017-03-06 ENCOUNTER — Other Ambulatory Visit: Payer: Self-pay | Admitting: Obstetrics and Gynecology

## 2017-03-06 VITALS — BP 128/82 | HR 76 | Resp 16 | Wt 151.0 lb

## 2017-03-06 DIAGNOSIS — D259 Leiomyoma of uterus, unspecified: Secondary | ICD-10-CM

## 2017-03-06 DIAGNOSIS — N83209 Unspecified ovarian cyst, unspecified side: Secondary | ICD-10-CM

## 2017-03-06 DIAGNOSIS — R971 Elevated cancer antigen 125 [CA 125]: Secondary | ICD-10-CM | POA: Diagnosis not present

## 2017-03-06 DIAGNOSIS — Z8041 Family history of malignant neoplasm of ovary: Secondary | ICD-10-CM

## 2017-03-06 DIAGNOSIS — N83201 Unspecified ovarian cyst, right side: Secondary | ICD-10-CM | POA: Diagnosis not present

## 2017-03-06 NOTE — Progress Notes (Signed)
GYNECOLOGY  VISIT   HPI: 51 y.o.   Married  Caucasian  female   G1P0010 with Patient's last menstrual period was 02/05/2017 (approximate).   here for follow up right ovarian cyst. She has been followed for a right ovarian cyst and an elevated CA 125.  She was diagnosed with a complex right ovarian cyst in 5/18, 2.9 x 2.8 x 1.7 cm. Most c/w an endometrioma or hemorrhagic cyst. CA 125 at that time was 62. The ultrasound was done secondary to a family history of ovarian cancer. She then had a f/u ultrasound in 7/18, the cyst was still complex but slightly smaller to 2.5 x 1.7 cm and her CA 125 was 52.8. She is still having regular cycles and is without complaints.   GYNECOLOGIC HISTORY: Patient's last menstrual period was 02/05/2017 (approximate). Contraception:vasectomy  Menopausal hormone therapy: none         OB History    Gravida Para Term Preterm AB Living   1       1     SAB TAB Ectopic Multiple Live Births                     Patient Active Problem List   Diagnosis Date Noted  . Genetic testing 11/08/2016  . Family history of ovarian cancer   . 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 High risk of ovarian cancer 08/07/2016  . Encounter for preventive health examination 09/07/2015  . Skin lesion of breast 09/07/2015    Past Medical History:  Diagnosis Date  . Family history of ovarian cancer   . Ovarian cyst     Past Surgical History:  Procedure Laterality Date  . ADENOIDECTOMY      Current Outpatient Medications  Medication Sig Dispense Refill  . cholecalciferol (VITAMIN D) 1000 units tablet Take 2,000 Units by mouth daily.    . Lactobacillus (PROBIOTIC ACIDOPHILUS) CAPS Take by mouth.    . Multiple Vitamin (MULTIVITAMIN) tablet Take 1 tablet by mouth daily.    . Omega 3 1000 MG CAPS Take by mouth.    . vitamin B-12 (CYANOCOBALAMIN) 1000 MCG tablet Take 1,000 mcg by mouth daily.     No current facility-administered medications for this visit.       ALLERGIES: Patient has no known allergies.  Family History  Problem Relation Age of Onset  . Drug abuse Mother   . COPD Mother   . Mental retardation Mother   . Early death Mother 53       blood clot  . Ovarian cancer Mother 22       dx 'late 06/17/22', died at 11 due to COPD  . Heart disease Mother   . Breast cancer Paternal Aunt 90       is now in 16's  . Alcohol abuse Sister   . Heart disease Maternal Uncle        died late 26's  . Heart attack Maternal Grandmother 52    Social History   Socioeconomic History  . Marital status: Married    Spouse name: Not on file  . Number of children: Not on file  . Years of education: Not on file  . Highest education level: Not on file  Social Needs  . Financial resource strain: Not on file  . Food insecurity - worry: Not on file  . Food insecurity - inability: Not on file  . Transportation needs - medical: Not on file  . Transportation needs - non-medical:  Not on file  Occupational History  . Not on file  Tobacco Use  . Smoking status: Never Smoker  . Smokeless tobacco: Never Used  Substance and Sexual Activity  . Alcohol use: Yes    Alcohol/week: 1.2 - 1.8 oz    Types: 2 - 3 Glasses of wine per week  . Drug use: No  . Sexual activity: Yes    Partners: Male    Birth control/protection: None  Other Topics Concern  . 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   Exercises but nor routinely.    Smoke detector in the home.   Wears her set belt.   Feels safe in her relationships.     Review of Systems  Constitutional: Negative.   HENT: Negative.   Eyes: Negative.   Respiratory: Negative.   Cardiovascular: Negative.   Gastrointestinal: Negative.   Genitourinary: Negative.   Musculoskeletal: Negative.   Skin: Negative.   Neurological: Negative.   Endo/Heme/Allergies: Negative.   Psychiatric/Behavioral: Negative.     PHYSICAL EXAMINATION:    BP  128/82 (BP Location: Right Arm, Patient Position: Sitting, Cuff Size: Normal)   Pulse 76   Resp 16   Wt 151 lb (68.5 kg)   LMP 02/05/2017 (Approximate)   BMI 24.37 kg/m     General appearance: alert, cooperative and appears stated age  Ultrasound images were reviewed with the patient  ASSESSMENT Complex right ovarian cyst, smaller in the last 5 months. Initially 2.9 cm, today down to 1.7 cm in maximum diameter.  Fibroid uterus Family history of ovarian cancer in her mother. The patient has had negative BRCA testing (mother not tested) Mildly elevated CA 125, could be elevated from her complex cyst or her fibroids. The last level went from 62 to 52    PLAN CA 125 As long as her CA 125 is stable will plan a f/u ultrasound in 4-6 months She is aware of the different surgical options    An After Visit Summary was printed and given to the patient.  10 minutes face to face time of which over 50% was spent in counseling.   CC: Howard Pouch

## 2017-03-07 ENCOUNTER — Other Ambulatory Visit: Payer: Self-pay | Admitting: Obstetrics and Gynecology

## 2017-03-07 DIAGNOSIS — Z8041 Family history of malignant neoplasm of ovary: Secondary | ICD-10-CM

## 2017-03-07 DIAGNOSIS — N83201 Unspecified ovarian cyst, right side: Secondary | ICD-10-CM

## 2017-03-07 LAB — CA 125: CANCER ANTIGEN (CA) 125: 34.7 U/mL (ref 0.0–38.1)

## 2017-03-12 ENCOUNTER — Encounter: Payer: Self-pay | Admitting: Family Medicine

## 2017-03-13 ENCOUNTER — Telehealth: Payer: Self-pay | Admitting: Family Medicine

## 2017-03-13 NOTE — Telephone Encounter (Signed)
Please read patient chart message. She is asking about a preventative colonoscopy and if she should schedule it prior to the end of years and she met her deductible. - Please advised patient if she has a gastroenterologist and she can schedule an appointment and they can discuss. Otherwise if he would be a new referral I'm not certain we would be able to get that cleared for her to meet the gastroenterologist and schedule a colonoscopy by the end of the year.  - Would place referral if desires, if not could wait until her physical, please referral at that time and colonoscopy could always be scheduled towards the end of next year as long as not having issues.

## 2017-03-14 ENCOUNTER — Encounter: Payer: Self-pay | Admitting: *Deleted

## 2017-03-14 NOTE — Telephone Encounter (Signed)
Message sent in MyChart.

## 2017-06-26 ENCOUNTER — Telehealth: Payer: Self-pay | Admitting: Obstetrics and Gynecology

## 2017-06-26 NOTE — Telephone Encounter (Signed)
Call placed to patient to review benefits and to schedule follow up ultrasound. Left voicemail message requesting a return call

## 2017-06-27 NOTE — Telephone Encounter (Signed)
Second call placed to patient to review benefits and schedule follow up ultrasound. Left voicemail message requesting a return call.

## 2017-06-28 NOTE — Telephone Encounter (Signed)
Third call placed to patient to review benefits and schedule follow up ultrasound with Dr Talbert Nan, Left voicemail message requesting a return call    cc: Lamont Snowball, RN  cc: Thayer Ohm  cc: Triage Nurse

## 2017-07-05 NOTE — Telephone Encounter (Signed)
Call placed to patient to review benefits and scheduled follow up ultrasound. Left voicemail message requesting a call.

## 2017-07-23 NOTE — Telephone Encounter (Signed)
Patient is ready to schedule her ultrasound. Patient is aware that Deloris Ping will need to call her tomorrow to review her benefits.

## 2017-07-24 NOTE — Telephone Encounter (Signed)
Spoke with patient regarding benefit for follow up ultrasound. Patient understood and agreeable. Patient ready to schedule. Patient scheduled 08/07/17 with Dr Talbert Nan. Patient aware of appointment date, arrival time and cancellation policy. No further questions. Ok to close  [employer] Dr Talbert Nan for final reveiw

## 2017-07-27 IMAGING — US US PELVIS COMPLETE
1 series · 13 of 25 positions shown · non-contrast
Comparison: None

CLINICAL DATA: Screening

EXAM:
TRANSABDOMINAL AND TRANSVAGINAL ULTRASOUND OF PELVIS
TECHNIQUE: Both transabdominal and transvaginal ultrasound examinations of the
pelvis were performed. Transabdominal technique was performed for
global imaging of the pelvis including uterus, ovaries, adnexal
regions, and pelvic cul-de-sac. It was necessary to proceed with
endovaginal exam following the transabdominal exam to visualize the
uterus, endometrium, ovaries and adnexa .

[Series 1: us pelvis complete · 0.27mm/px · 13 of 63 slices shown]
[im 1/63]
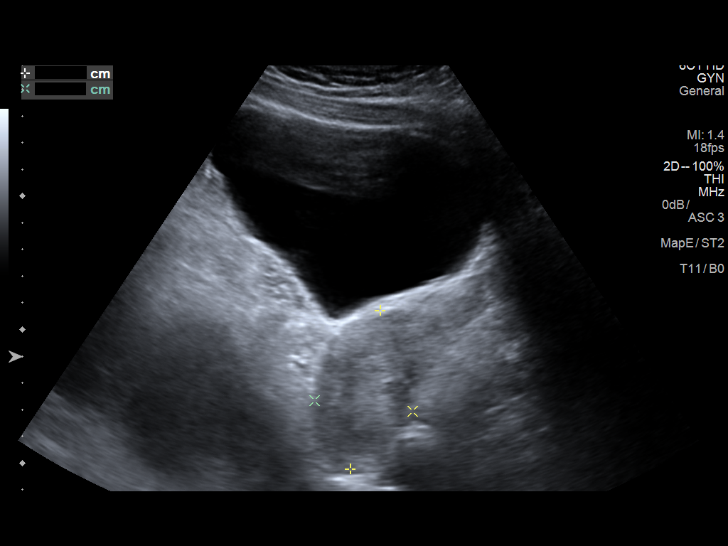
[im 6/63]
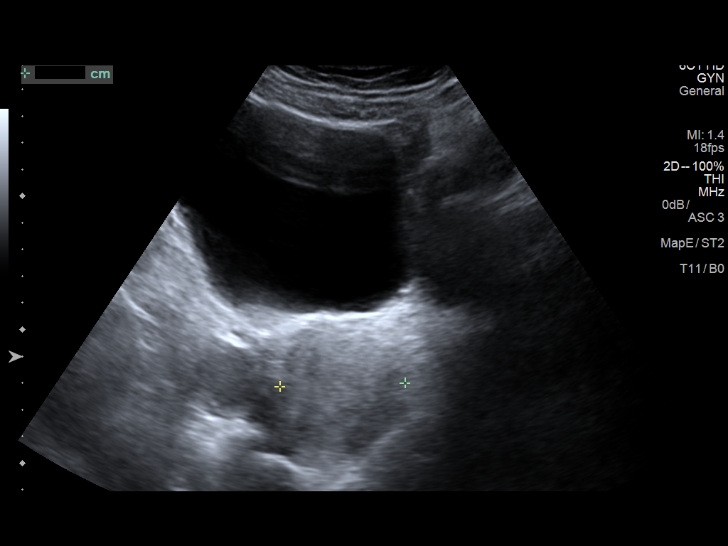
[im 11/63]
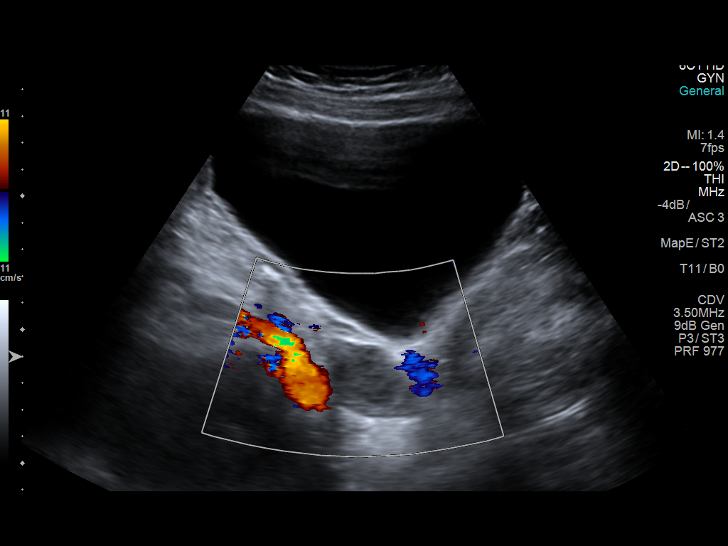
[im 16/63]
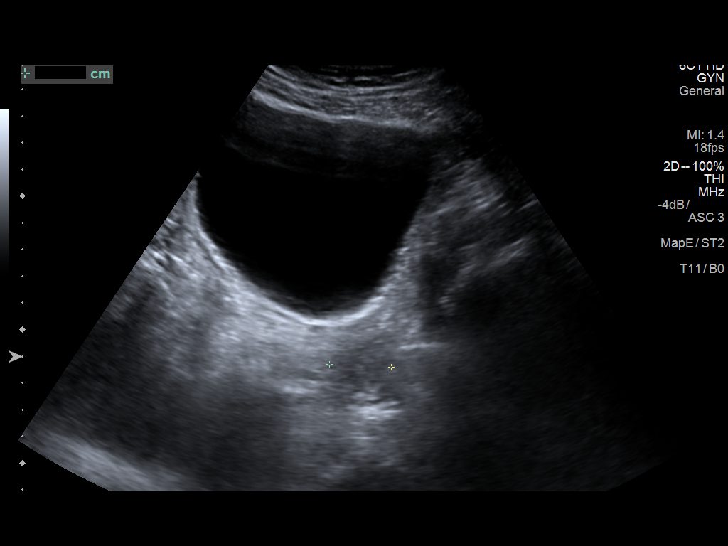
[im 21/63]
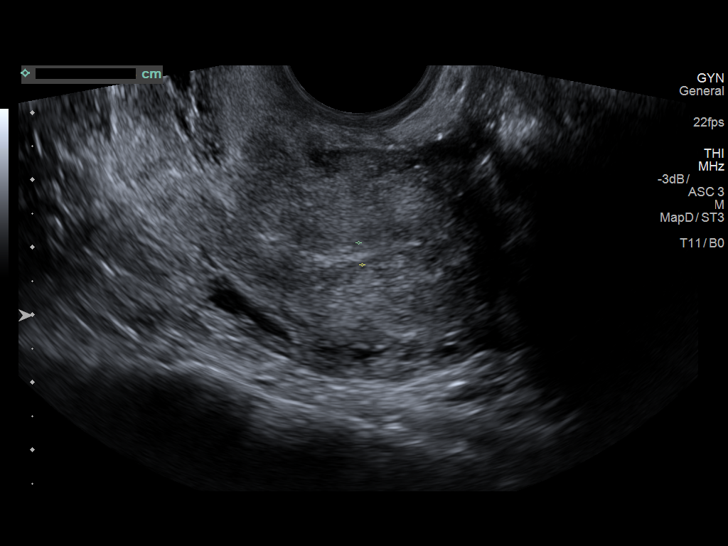
[im 26/63]
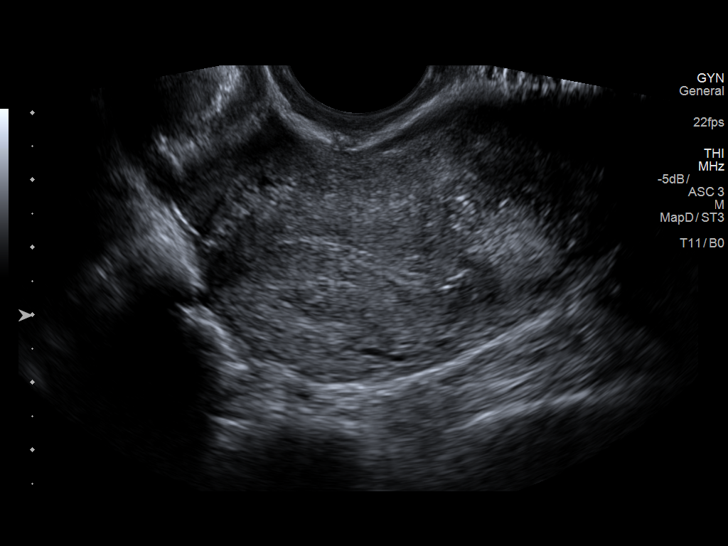
[im 32/63]
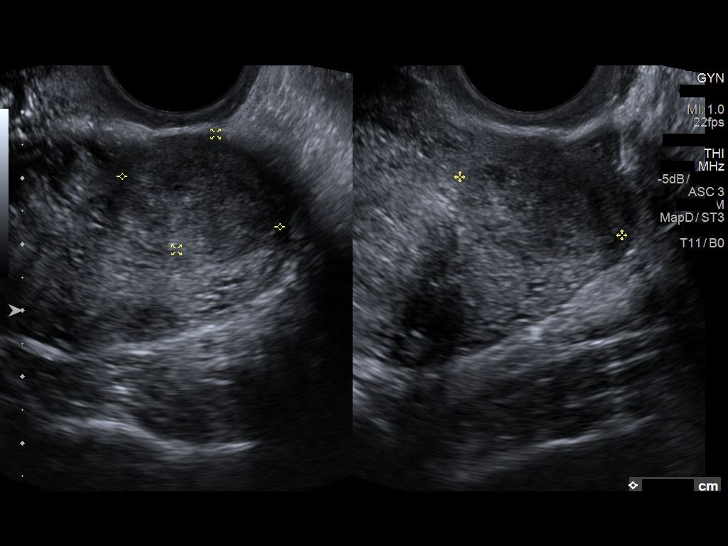
[im 37/63]
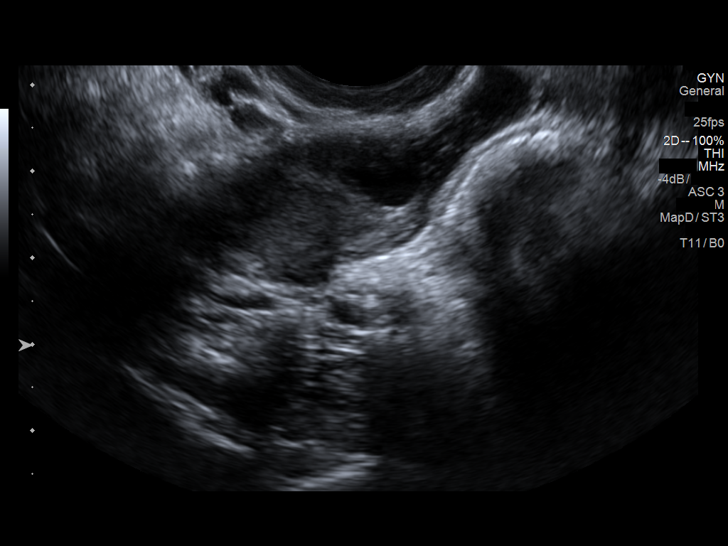
[im 42/63]
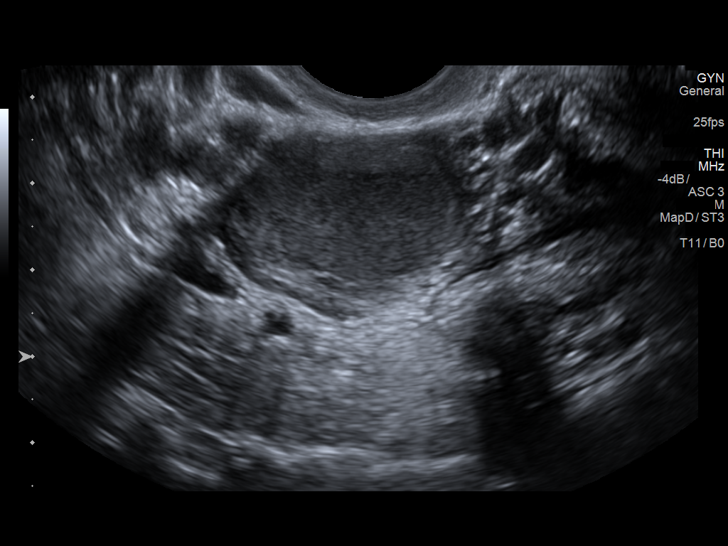
[im 47/63]
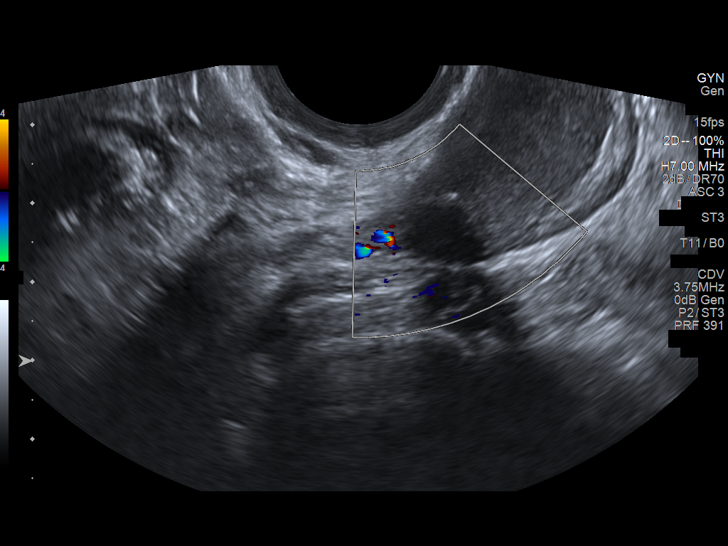
[im 52/63]
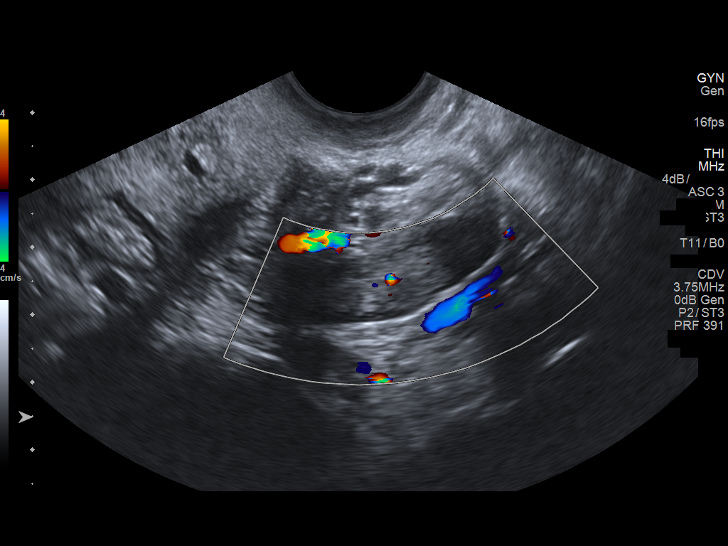
[im 57/63]
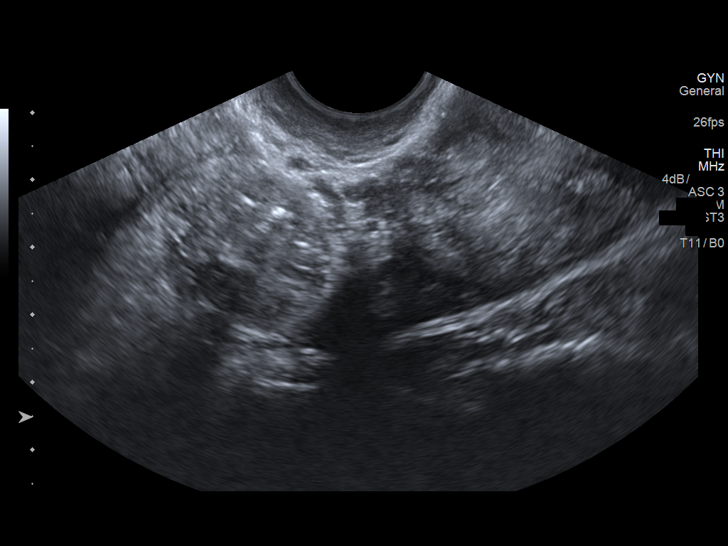
[im 63/63]
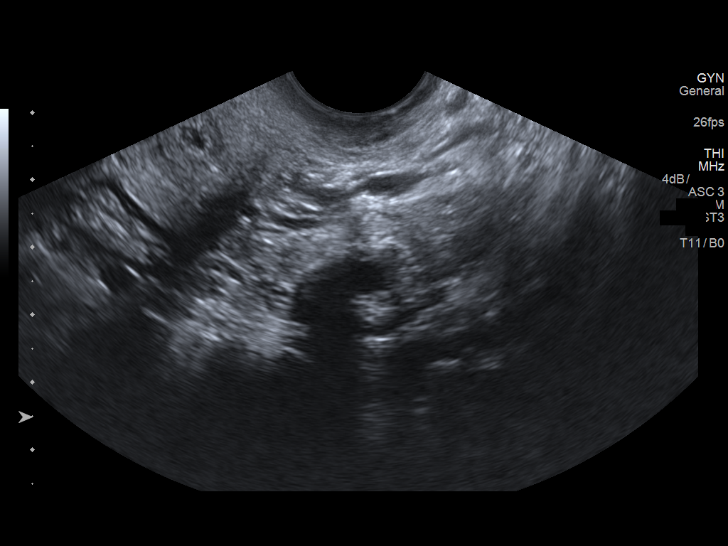

[13 of 25 positions shown; findings below may reference images not displayed]

FINDINGS: Uterus

Measurements: 6.9 x 5.2 x 4.0 cm. Multiple fibroids, the largest
cm in the left fundus. Submucosal fibroid is also noted which are
smaller, measuring up to 1.6 cm.

Endometrium

Thickness: 3 mm in thickness.  No focal abnormality visualized.

Right ovary

Measurements: 4.1 x 2.5 x 3.0 cm. Hypoechoic area within the uterus
measures 2.9 x 2.8 x 1.7 cm. Differential considerations would
include hemorrhagic cyst or endometrioma.

Left ovary

Measurements: 3.1 x 1.2 x 2.0 cm. Normal appearance/no adnexal mass.

Other findings

Small amount of free fluid.
IMPRESSION: 2.9 cm hypoechoic area within the right ovary. The appearance
suggests endometrioma or hemorrhagic cyst. This could be followed
with repeat ultrasound in 3-6 months.

Uterine fibroids.

## 2017-08-07 ENCOUNTER — Other Ambulatory Visit: Payer: Self-pay | Admitting: Obstetrics and Gynecology

## 2017-08-07 ENCOUNTER — Ambulatory Visit: Payer: 59 | Admitting: Obstetrics and Gynecology

## 2017-08-07 ENCOUNTER — Encounter: Payer: Self-pay | Admitting: Obstetrics and Gynecology

## 2017-08-07 ENCOUNTER — Ambulatory Visit (INDEPENDENT_AMBULATORY_CARE_PROVIDER_SITE_OTHER): Payer: 59

## 2017-08-07 ENCOUNTER — Other Ambulatory Visit: Payer: Self-pay

## 2017-08-07 VITALS — BP 130/80 | HR 80 | Resp 16 | Wt 147.0 lb

## 2017-08-07 DIAGNOSIS — Z8041 Family history of malignant neoplasm of ovary: Secondary | ICD-10-CM

## 2017-08-07 DIAGNOSIS — N83201 Unspecified ovarian cyst, right side: Secondary | ICD-10-CM
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
## 2017-08-08 ENCOUNTER — Other Ambulatory Visit: Payer: Self-pay | Admitting: Obstetrics and Gynecology

## 2017-08-08 DIAGNOSIS — R971 Elevated cancer antigen 125 [CA 125]: Secondary | ICD-10-CM

## 2017-08-08 DIAGNOSIS — N83209 Unspecified ovarian cyst, unspecified side: Secondary | ICD-10-CM

## 2017-08-08 LAB — CA 125: Cancer Antigen (CA) 125: 50.1 U/mL — ABNORMAL HIGH (ref 0.0–38.1)

## 2018-02-11 NOTE — Progress Notes (Signed)
GYNECOLOGY  VISIT   HPI: 52 y.o.   Married White or Caucasian Not Hispanic or Latino  female   G1P0010 with Patient's last menstrual period was 01/26/2018 (exact date).   here for consult from PUS.    The patient was originally noted to have a complex right ovarian cyst in 5/18 at the time of a screening ultrasound secondary to a family history of ovarian cancer in her mother. At that time it was 2.9 cm and was felt to be c/w an endometrioma or hemorrhagic cyst. She had a CA 125 of 62. She has had serial CA125's and ultrasounds since then. Her CA 125 normalized, then went back up to 50.1 in 4/19. Her ultrasound at that visit showed a stable 2.6 x 1.8 cm avascular cyst with debris in the right ovary.  She continues to monthly cycles x 5 days. Saturates a super tampon in a couple of hours. Cramps vary from cycle to cycle, but are overall tolerable.   The patient has had negative BRCA testing, but her mother was never tested.   GYNECOLOGIC HISTORY: Patient's last menstrual period was 01/26/2018 (exact date). Contraception: None Menopausal hormone therapy: None       OB History    Gravida  1   Para      Term      Preterm      AB  1   Living        SAB      TAB      Ectopic      Multiple      Live Births                 Patient Active Problem List   Diagnosis Date Noted  . Genetic testing 11/08/2016  . Family history of ovarian cancer   . 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 High risk of ovarian cancer 08/07/2016  . Encounter for preventive health examination 09/07/2015  . Skin lesion of breast 09/07/2015    Past Medical History:  Diagnosis Date  . Family history of ovarian cancer   . Ovarian cyst     Past Surgical History:  Procedure Laterality Date  . ADENOIDECTOMY      Current Outpatient Medications  Medication Sig Dispense Refill  . cholecalciferol (VITAMIN D) 1000 units tablet Take 2,000 Units by mouth daily.    . Lactobacillus (PROBIOTIC  ACIDOPHILUS) CAPS Take by mouth.    . Multiple Vitamin (MULTIVITAMIN) tablet Take 1 tablet by mouth daily.    . Omega 3 1000 MG CAPS Take by mouth.    . vitamin B-12 (CYANOCOBALAMIN) 1000 MCG tablet Take 1,000 mcg by mouth daily.     No current facility-administered medications for this visit.      ALLERGIES: Patient has no known allergies.  Family History  Problem Relation Age of Onset  . Drug abuse Mother   . COPD Mother   . Mental retardation Mother   . Early death Mother 18       blood clot  . Ovarian cancer Mother 26       dx 'late 06/20/22', died at 74 due to COPD  . Heart disease Mother   . Breast cancer Paternal Aunt 1       is now in 59's  . Alcohol abuse Sister   . Heart disease Maternal Uncle        died late 42's  . Heart attack Maternal Grandmother 52    Social History   Socioeconomic  History  . Marital status: Married    Spouse name: Not on file  . Number of children: Not on file  . Years of education: Not on file  . Highest education level: Not on file  Occupational History  . Not on file  Social Needs  . Financial resource strain: Not on file  . Food insecurity:    Worry: Not on file    Inability: Not on file  . Transportation needs:    Medical: Not on file    Non-medical: Not on file  Tobacco Use  . Smoking status: Never Smoker  . Smokeless tobacco: Never Used  Substance and Sexual Activity  . Alcohol use: Yes    Alcohol/week: 2.0 - 3.0 standard drinks    Types: 2 - 3 Glasses of wine per week  . Drug use: No  . Sexual activity: Yes    Partners: Male    Birth control/protection: None  Lifestyle  . Physical activity:    Days per week: Not on file    Minutes per session: Not on file  . Stress: Not on file  Relationships  . Social connections:    Talks on phone: Not on file    Gets together: Not on file    Attends religious service: Not on file    Active member of club or organization: Not on file    Attends meetings of clubs or  organizations: Not on file    Relationship status: Not on file  . Intimate partner violence:    Fear of current or ex partner: Not on file    Emotionally abused: Not on file    Physically abused: Not on file    Forced sexual activity: Not on file  Other Topics Concern  . 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   Exercises but nor routinely.    Smoke detector in the home.   Wears her set belt.   Feels safe in her relationships.     Review of Systems  Constitutional: Negative.   HENT: Negative.   Eyes: Negative.   Respiratory: Negative.   Cardiovascular: Negative.   Gastrointestinal: Negative.   Genitourinary: Negative.   Musculoskeletal: Negative.   Skin: Negative.   Neurological: Negative.   Endo/Heme/Allergies: Negative.   Psychiatric/Behavioral: Negative.     PHYSICAL EXAMINATION:    BP 112/84 (BP Location: Right Arm, Patient Position: Sitting, Cuff Size: Normal)   Pulse 76   Wt 152 lb (68.9 kg)   LMP 01/26/2018 (Exact Date)   BMI 24.53 kg/m     General appearance: alert, cooperative and appears stated age  Ultrasound images reviewed with the patient. Complex right ovarian cyst has increased in size, today measuring 3.3 x 3.2 x 2.5 cm  ASSESSMENT Complex right ovarian cyst, suspect endometrium.     PLAN CA 125 today, depending on the results will schedule f/u ultrasound in 3-6 months She is aware of the option of surgical removal She will f/u with her primary for her annual exam   An After Visit Summary was printed and given to the patient.

## 2018-02-12 ENCOUNTER — Other Ambulatory Visit: Payer: Self-pay | Admitting: Obstetrics and Gynecology

## 2018-02-12 ENCOUNTER — Encounter: Payer: Self-pay | Admitting: Obstetrics and Gynecology

## 2018-02-12 ENCOUNTER — Ambulatory Visit (INDEPENDENT_AMBULATORY_CARE_PROVIDER_SITE_OTHER): Payer: 59 | Admitting: Obstetrics and Gynecology

## 2018-02-12 ENCOUNTER — Other Ambulatory Visit: Payer: Self-pay

## 2018-02-12 ENCOUNTER — Ambulatory Visit (INDEPENDENT_AMBULATORY_CARE_PROVIDER_SITE_OTHER): Payer: 59

## 2018-02-12 VITALS — BP 112/84 | HR 76 | Wt 152.0 lb

## 2018-02-12 DIAGNOSIS — R971 Elevated cancer antigen 125 [CA 125]: Secondary | ICD-10-CM

## 2018-02-12 DIAGNOSIS — Z8041 Family history of malignant neoplasm of ovary: Secondary | ICD-10-CM

## 2018-02-12 DIAGNOSIS — N83209 Unspecified ovarian cyst, unspecified side: Secondary | ICD-10-CM

## 2018-02-12 DIAGNOSIS — N83201 Unspecified ovarian cyst, right side: Secondary | ICD-10-CM | POA: Diagnosis not present

## 2018-02-13 ENCOUNTER — Encounter: Payer: Self-pay | Admitting: Obstetrics and Gynecology

## 2018-02-13 LAB — CA 125: CANCER ANTIGEN (CA) 125: 110 U/mL — AB (ref 0.0–38.1)

## 2018-02-19 ENCOUNTER — Telehealth: Payer: Self-pay | Admitting: Emergency Medicine

## 2018-02-19 DIAGNOSIS — N83201 Unspecified ovarian cyst, right side: Secondary | ICD-10-CM

## 2018-02-19 DIAGNOSIS — D259 Leiomyoma of uterus, unspecified: Secondary | ICD-10-CM

## 2018-02-19 DIAGNOSIS — Z8041 Family history of malignant neoplasm of ovary: Secondary | ICD-10-CM

## 2018-02-19 DIAGNOSIS — R971 Elevated cancer antigen 125 [CA 125]: Secondary | ICD-10-CM

## 2018-02-19 NOTE — Telephone Encounter (Signed)
Patient returning call to Tracy. °

## 2018-02-19 NOTE — Telephone Encounter (Signed)
-----   Message from Salvadore Dom, MD sent at 02/15/2018  5:09 PM EDT ----- Spoke with the patient, reviewed results. I'm going to send her for a consultation with GYN oncology. I told her that endometriosis can make her CA 125 go up. Need Oncologists opinion as to the safety of watching this vs need for surgery now. Please call and schedule her to see GYN Oncology. The patient is expecting your call.

## 2018-02-19 NOTE — Telephone Encounter (Signed)
Return call to patient.  Discussed appointments information as below. You now have an appointment with the Gynecologic Oncologist at the [place] located at the [facility]. The appointment is scheduled for 02/22/18 at 1:45 with Dr. Gerarda Fraction.  They would like you to arrive 30 minutes early for registration. Please bring your photo identification card and a copy of your insurance card. Please be aware that the Doctor will complete a pelvic exam on this day as well.   The address is: [facility] at [address], [place] [address]; [place] New address:  [address] [place]  Pt given information. Agreeable to appointment.  Update to Dr. Talbert Nan.  Encounter closed.

## 2018-02-19 NOTE — Telephone Encounter (Signed)
Call to [facility] oncology. Spoke with Melissa. She will call back to schedule an appointment for patient.

## 2018-02-22 ENCOUNTER — Encounter: Payer: Self-pay | Admitting: Obstetrics

## 2018-02-22 ENCOUNTER — Inpatient Hospital Stay: Payer: 59 | Attending: Obstetrics | Admitting: Obstetrics

## 2018-02-22 VITALS — BP 137/69 | HR 70 | Temp 98.0°F | Resp 16 | Ht 66.0 in | Wt 154.0 lb

## 2018-02-22 DIAGNOSIS — R971 Elevated cancer antigen 125 [CA 125]: Secondary | ICD-10-CM | POA: Insufficient documentation

## 2018-02-22 DIAGNOSIS — Z90710 Acquired absence of both cervix and uterus: Secondary | ICD-10-CM | POA: Diagnosis not present

## 2018-02-22 DIAGNOSIS — N83201 Unspecified ovarian cyst, right side: Secondary | ICD-10-CM | POA: Insufficient documentation

## 2018-02-22 DIAGNOSIS — Z8041 Family history of malignant neoplasm of ovary: Secondary | ICD-10-CM | POA: Diagnosis not present
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
## 2018-02-22 NOTE — Patient Instructions (Signed)
Followup with Dr. Talbert Nan Return to see [place] if surgery desired

## 2018-02-24 ENCOUNTER — Encounter: Payer: Self-pay | Admitting: Obstetrics

## 2018-02-26 ENCOUNTER — Telehealth: Payer: Self-pay | Admitting: Obstetrics and Gynecology

## 2018-02-26 DIAGNOSIS — R971 Elevated cancer antigen 125 [CA 125]: Secondary | ICD-10-CM

## 2018-02-26 NOTE — Telephone Encounter (Signed)
Left message to call Bradlee Heitman at [phone]. 

## 2018-02-26 NOTE — Telephone Encounter (Signed)
Please let the patient know that I got the recommendations of Dr Gerarda Fraction and set her up for a lab appointment in December for a repeat CA 125. We will plan the f/u ultrasound after these results are back.

## 2018-02-28 NOTE — Telephone Encounter (Signed)
Patient returned call

## 2018-02-28 NOTE — Telephone Encounter (Signed)
Spoke with patient. Information from [place] given. Patient verbalizes understanding. Lab appointment scheduled for 04/15/2018 at 8:30 am. Patient is agreeable to date and time. Lab order placed. Encounter closed.

## 2018-04-15 ENCOUNTER — Ambulatory Visit: Payer: 59

## 2018-04-15 DIAGNOSIS — R971 Elevated cancer antigen 125 [CA 125]: Secondary | ICD-10-CM

## 2018-04-16 LAB — CA 125: CANCER ANTIGEN (CA) 125: 54.7 U/mL — AB (ref 0.0–38.1)

## 2018-04-18 ENCOUNTER — Telehealth: Payer: Self-pay

## 2018-04-18 NOTE — Telephone Encounter (Signed)
Spoke with patient. Results given. Patient verbalizes understanding. PUS scheduled for 08/08/2018 at 3:30 pm with 4 pm consult with Dr.Jertson. Patient is agreeable to date and time. Encounter closed.

## 2018-04-18 NOTE — Telephone Encounter (Signed)
-----   Message from Salvadore Dom, MD sent at 04/16/2018 10:53 AM EST ----- Results and any recommendations were sent via [place]. Please set up an ultrasound and CA 125 in 4/20  Hi Iliza, Your CA 125 is down to 54.7. I would recommend that we do another CA 125 and ultrasound in 4/20. Please call if you have any questions or concerns. Happy Holidays! Sumner Boast

## 2018-08-01 ENCOUNTER — Telehealth: Payer: Self-pay | Admitting: *Deleted

## 2018-08-01 NOTE — Telephone Encounter (Signed)
Return call to patient. Per ROI can leave message on voice mail.  Left message need to reschedule pelvic ultrasound due to schedule limitations related to Covid 19.  Dr Talbert Nan in office on 08-15-2018. Can speak to any triage nurse.

## 2018-08-01 NOTE — Telephone Encounter (Signed)
Patient returned call

## 2018-08-01 NOTE — Telephone Encounter (Signed)
Phone call to patient regarding scheduled pelvic ultrasound on 08-08-2018. Due to office schedule limitations from Covid 19, need to reschedule appointment.  Left message to return call.

## 2018-08-01 NOTE — Telephone Encounter (Signed)
Patient returned call. Ultrasound rescheduled to 08-15-2018.   Encounter closed.

## 2018-08-08 ENCOUNTER — Other Ambulatory Visit: Payer: 59

## 2018-08-08 ENCOUNTER — Other Ambulatory Visit: Payer: 59 | Admitting: Obstetrics and Gynecology

## 2018-08-15 ENCOUNTER — Other Ambulatory Visit: Payer: Self-pay

## 2018-08-15 ENCOUNTER — Encounter: Payer: Self-pay | Admitting: Obstetrics and Gynecology

## 2018-08-15 ENCOUNTER — Ambulatory Visit: Payer: BLUE CROSS/BLUE SHIELD | Admitting: Obstetrics and Gynecology

## 2018-08-15 ENCOUNTER — Ambulatory Visit (INDEPENDENT_AMBULATORY_CARE_PROVIDER_SITE_OTHER): Payer: BLUE CROSS/BLUE SHIELD

## 2018-08-15 ENCOUNTER — Other Ambulatory Visit: Payer: Self-pay | Admitting: Obstetrics and Gynecology

## 2018-08-15 ENCOUNTER — Other Ambulatory Visit: Payer: Self-pay | Admitting: *Deleted

## 2018-08-15 VITALS — BP 120/80 | HR 70 | Temp 97.7°F | Resp 16 | Wt 160.0 lb

## 2018-08-15 DIAGNOSIS — N951 Menopausal and female climacteric states: Secondary | ICD-10-CM | POA: Diagnosis not present

## 2018-08-15 DIAGNOSIS — N83201 Unspecified ovarian cyst, right side: Secondary | ICD-10-CM

## 2018-08-15 DIAGNOSIS — N882 Stricture and stenosis of cervix uteri: Secondary | ICD-10-CM | POA: Diagnosis not present

## 2018-08-15 DIAGNOSIS — N841 Polyp of cervix uteri: Secondary | ICD-10-CM | POA: Diagnosis not present

## 2018-08-15 DIAGNOSIS — N939 Abnormal uterine and vaginal bleeding, unspecified: Secondary | ICD-10-CM

## 2018-08-15 DIAGNOSIS — Z8041 Family history of malignant neoplasm of ovary: Secondary | ICD-10-CM

## 2018-08-15 DIAGNOSIS — R971 Elevated cancer antigen 125 [CA 125]: Secondary | ICD-10-CM | POA: Diagnosis not present

## 2018-08-15 DIAGNOSIS — Z9189 Other specified personal risk factors, not elsewhere classified: Secondary | ICD-10-CM | POA: Diagnosis not present

## 2018-08-15 DIAGNOSIS — N83209 Unspecified ovarian cyst, unspecified side: Secondary | ICD-10-CM

## 2018-08-15 MED ORDER — MEDROXYPROGESTERONE ACETATE 5 MG PO TABS: ORAL_TABLET | ORAL | 1 refills | Status: DC

## 2018-08-15 NOTE — Progress Notes (Signed)
GYNECOLOGY  VISIT   HPI: 53 y.o.   Married White or Caucasian Not Hispanic or Latino  female   G1P0010 with Patient's last menstrual period was 07/07/2018 (exact date).   here for ultrasound & consult    She is being followed with a mildly elevated CA 125 and complex right ovarian cyst, most c/w an endometrioma. She has declined surgery. She had a consult with GYN oncology who felt following her was reasonable.  She was having monthly cycles until 12/19. That cycle was about a week early, light bleeding for 3 days. Next cycle in early 08/14/22. Cycle in 2022-08-14 was 7-8 days, heavy, she could saturate a super tampon in a couple of hours. No cycle since then. No bleeding in between her cycles.  She is having some hot flashes, tolerable. No night sweats. No change in vaginal dryness. No dyspareunia.  She is doing homeopathic remedies to try and help her cyst.   GYNECOLOGIC HISTORY: Patient's last menstrual period was 07/07/2018 (exact date). Contraception:none Menopausal hormone therapy: none        OB History    Gravida  1   Para      Term      Preterm      AB  1   Living        SAB      TAB      Ectopic      Multiple      Live Births                 Patient Active Problem List   Diagnosis Date Noted  . Genetic testing 11/08/2016  . Family history of ovarian cancer   . 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 High risk of ovarian cancer 08/07/2016  . Encounter for preventive health examination 09/07/2015  . Skin lesion of breast 09/07/2015    Past Medical History:  Diagnosis Date  . Family history of ovarian cancer   . Ovarian cyst     Past Surgical History:  Procedure Laterality Date  . ADENOIDECTOMY      Current Outpatient Medications  Medication Sig Dispense Refill  . cholecalciferol (VITAMIN D) 1000 units tablet Take by mouth daily.     . Lactobacillus (PROBIOTIC ACIDOPHILUS) CAPS Take by mouth.    . Multiple Vitamin (MULTIVITAMIN) tablet Take 1 tablet by  mouth daily.    . Omega 3 1000 MG CAPS Take by mouth.    . vitamin B-12 (CYANOCOBALAMIN) 1000 MCG tablet Take 1,000 mcg by mouth daily.     No current facility-administered medications for this visit.      ALLERGIES: Patient has no known allergies.  Family History  Problem Relation Age of Onset  . Drug abuse Mother   . COPD Mother   . Mental retardation Mother   . Early death Mother 32       blood clot  . Ovarian cancer Mother 18       dx 'late 08-14-2022', died at 85 due to COPD  . Heart disease Mother   . Breast cancer Paternal Aunt 39       is now in 50's  . Alcohol abuse Sister   . Heart disease Maternal Uncle        died late 7's  . Heart attack Maternal Grandmother 52    Social History   Socioeconomic History  . Marital status: Married    Spouse name: Not on file  . Number of children: Not on file  .  Years of education: Not on file  . Highest education level: Not on file  Occupational History  . Not on file  Social Needs  . Financial resource strain: Not on file  . Food insecurity:    Worry: Not on file    Inability: Not on file  . Transportation needs:    Medical: Not on file    Non-medical: Not on file  Tobacco Use  . Smoking status: Never Smoker  . Smokeless tobacco: Never Used  Substance and Sexual Activity  . Alcohol use: Yes    Alcohol/week: 2.0 - 3.0 standard drinks    Types: 2 - 3 Glasses of wine per week  . Drug use: No  . Sexual activity: Yes    Partners: Male    Birth control/protection: Other-see comments    Comment: Husband had a vasectomy  Lifestyle  . Physical activity:    Days per week: Not on file    Minutes per session: Not on file  . Stress: Not on file  Relationships  . Social connections:    Talks on phone: Not on file    Gets together: Not on file    Attends religious service: Not on file    Active member of club or organization: Not on file    Attends meetings of clubs or organizations: Not on file    Relationship status:  Not on file  . Intimate partner violence:    Fear of current or ex partner: Not on file    Emotionally abused: Not on file    Physically abused: Not on file    Forced sexual activity: Not on file  Other Topics Concern  . 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   Exercises but nor routinely.    Smoke detector in the home.   Wears her set belt.   Feels safe in her relationships.     Review of Systems  Constitutional: Negative.   HENT: Negative.   Eyes: Negative.   Respiratory: Negative.   Cardiovascular: Negative.   Gastrointestinal: Negative.   Genitourinary: Negative.   Musculoskeletal: Negative.   Skin: Negative.   Neurological: Negative.   Endo/Heme/Allergies: Negative.   Psychiatric/Behavioral: Negative.     PHYSICAL EXAMINATION:    BP 120/80   Pulse 70   Temp 97.7 F (36.5 C) (Oral)   Resp 16   Wt 160 lb (72.6 kg)   LMP 07/07/2018 (Exact Date)   BMI 25.82 kg/m     General appearance: alert, cooperative and appears stated age   Pelvic: External genitalia:  no lesions              Urethra:  normal appearing urethra with no masses, tenderness or lesions              Bartholins and Skenes: normal                 Vagina: normal appearing vagina with normal color and discharge, no lesions              Cervix: no lesions               Sonohysterogram The procedure and risks of the procedure were reviewed with the patient, consent form was signed. A speculum was placed in the vagina and the cervix was cleansed with betadine. A tenaculum was placed. The cervix was dilated with the mini-dilators. The sonohysterogram catheter was inserted into the  uterine cavity without difficulty. The tenaculum and speculum were removed. Saline was infused under direct observation with the ultrasound. There appears to be a high endocervical polyp, possibly in the low LUS. No clear intrauterine polyps.The catheter was  removed.    Chaperone was present for exam.  ASSESSMENT Complex right ovarian cyst, c/w endometrioma H/O elevated CA 125 She has seen Gyn Oncology, declined surgery, f/u with ultrasound and CA 125 recommended Perimenopausal with some spacing of her cycles High cervical polyp (less likely LUS), no symptoms    PLAN CA 125 Plan repeat ultrasound and CA 125 in 6 months We discussed the option of hysteroscopy, D&C vs f/u. Given that the polyp appears cervical and not uterine will follow for now. If she develops intermenstrual or postcoital spotting she will call Discussed perimenopause Cyclic provera prescribed   An After Visit Summary was printed and given to the patient.  ~25 minutes face to face time of which over 50% was spent in counseling.

## 2018-08-16 ENCOUNTER — Other Ambulatory Visit: Payer: Self-pay | Admitting: Obstetrics and Gynecology

## 2018-08-16 DIAGNOSIS — N83201 Unspecified ovarian cyst, right side: Secondary | ICD-10-CM

## 2018-08-16 LAB — CA 125: Cancer Antigen (CA) 125: 32.8 U/mL (ref 0.0–38.1)

## 2019-02-03 ENCOUNTER — Telehealth: Payer: Self-pay | Admitting: Obstetrics and Gynecology

## 2019-02-03 NOTE — Telephone Encounter (Signed)
Call placed to patient to review benefit and scheduled follow up ultrasound. Left voicemail message requesting a return call

## 2019-02-17 NOTE — Progress Notes (Signed)
GYNECOLOGY  VISIT   HPI: 53 y.o.   Married White or Caucasian Not Hispanic or Latino  female   G1P0010 with No LMP recorded (lmp unknown). (Menstrual status: Irregular Periods).   here for consult after PUS.   LMP early October, bleed for 7-9 days normal flow. PMP was in 5/20. No intermenstrual spotting. No hot flashes, no night sweats, no vaginal dryness.  GYNECOLOGIC HISTORY: No LMP recorded (lmp unknown). (Menstrual status: Irregular Periods). Contraception: Spouse with vasectomy Menopausal hormone therapy: None        OB History    Gravida  1   Para      Term      Preterm      AB  1   Living        SAB      TAB      Ectopic      Multiple      Live Births                 Patient Active Problem List   Diagnosis Date Noted  . Genetic testing 11/08/2016  . Family history of ovarian cancer   . 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 High risk of ovarian cancer 08/07/2016  . Encounter for preventive health examination 09/07/2015  . Skin lesion of breast 09/07/2015    Past Medical History:  Diagnosis Date  . Family history of ovarian cancer   . Ovarian cyst     Past Surgical History:  Procedure Laterality Date  . ADENOIDECTOMY      Current Outpatient Medications  Medication Sig Dispense Refill  . cholecalciferol (VITAMIN D) 1000 units tablet Take by mouth daily.     . Lactobacillus (PROBIOTIC ACIDOPHILUS) CAPS Take by mouth.    . Multiple Vitamin (MULTIVITAMIN) tablet Take 1 tablet by mouth daily.    . Omega 3 1000 MG CAPS Take by mouth.    . vitamin B-12 (CYANOCOBALAMIN) 1000 MCG tablet Take 1,000 mcg by mouth daily.    . medroxyPROGESTERone (PROVERA) 5 MG tablet Take one tablet a day for 5 days every other month if no spontaneous menses. (Patient not taking: Reported on 02/18/2019) 15 tablet 1   No current facility-administered medications for this visit.      ALLERGIES: Patient has no known allergies.  Family History  Problem Relation Age of  Onset  . Drug abuse Mother   . COPD Mother   . Mental retardation Mother   . Early death Mother 65       blood clot  . Ovarian cancer Mother 84       dx 'late 2022/08/06', died at 64 due to COPD  . Heart disease Mother   . Breast cancer Paternal Aunt 87       is now in 8's  . Alcohol abuse Sister   . Heart disease Maternal Uncle        died late 54's  . Heart attack Maternal Grandmother 52    Social History   Socioeconomic History  . Marital status: Married    Spouse name: Not on file  . Number of children: Not on file  . Years of education: Not on file  . Highest education level: Not on file  Occupational History  . Not on file  Social Needs  . Financial resource strain: Not on file  . Food insecurity    Worry: Not on file    Inability: Not on file  . Transportation needs    Medical:  Not on file    Non-medical: Not on file  Tobacco Use  . Smoking status: Never Smoker  . Smokeless tobacco: Never Used  Substance and Sexual Activity  . Alcohol use: Yes    Alcohol/week: 2.0 - 3.0 standard drinks    Types: 2 - 3 Glasses of wine per week  . Drug use: No  . Sexual activity: Yes    Partners: Male    Birth control/protection: Other-see comments    Comment: Husband had a vasectomy  Lifestyle  . Physical activity    Days per week: Not on file    Minutes per session: Not on file  . Stress: Not on file  Relationships  . Social Herbalist on phone: Not on file    Gets together: Not on file    Attends religious service: Not on file    Active member of club or organization: Not on file    Attends meetings of clubs or organizations: Not on file    Relationship status: Not on file  . Intimate partner violence    Fear of current or ex partner: Not on file    Emotionally abused: Not on file    Physically abused: Not on file    Forced sexual activity: Not on file  Other Topics Concern  . 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   Exercises but nor routinely.    Smoke detector in the home.   Wears her set belt.   Feels safe in her relationships.     Review of Systems  Constitutional: Negative.   HENT: Negative.   Eyes: Negative.   Respiratory: Negative.   Cardiovascular: Negative.   Gastrointestinal: Negative.   Genitourinary: Negative.   Musculoskeletal: Negative.   Skin: Negative.   Neurological: Negative.   Endo/Heme/Allergies: Negative.   Psychiatric/Behavioral: Negative.     PHYSICAL EXAMINATION:    BP 110/82 (BP Location: Right Arm, Patient Position: Sitting, Cuff Size: Normal)   Pulse 68   Temp (!) 97.2 F (36.2 C) (Skin)   LMP  (LMP Unknown) Comment: Oct 2020    General appearance: alert, cooperative and appears stated age   Reviewed ultrasound images with the patient. Multiple small myomas. Stripe is 6 mm. Right ovarian cyst is smaller, 15 x 13 mm, avascular (on prior ultrasound it was up to 33 x 25 mm).   ASSESSMENT Complex right ovarian cyst getting smaller, most c/w endometrioma Prior mildly elevated CA 125, 6 months ago it was normal.  She has seen GYN Oncology, declines surgery. F/U with ultrasound and CA 125 recommended Perimenopausal, spacing of cycles.  Likely high endocervical polyp on ultrasound, no spotting    PLAN CA 125 today Depending on results will make a plan for f/u CA 125 and ultrasound in 6 or 12 months Patient will do her annual exam with Dr Raoul Pitch Cyclic provera sent to help with irregular cycles Information on perimenopause given.    An After Visit Summary was printed and given to the patient.  CC: Dr Raoul Pitch

## 2019-02-18 ENCOUNTER — Other Ambulatory Visit: Payer: Self-pay

## 2019-02-18 ENCOUNTER — Ambulatory Visit: Payer: BLUE CROSS/BLUE SHIELD | Admitting: Obstetrics and Gynecology

## 2019-02-18 ENCOUNTER — Ambulatory Visit (INDEPENDENT_AMBULATORY_CARE_PROVIDER_SITE_OTHER): Payer: BLUE CROSS/BLUE SHIELD

## 2019-02-18 ENCOUNTER — Encounter: Payer: Self-pay | Admitting: Obstetrics and Gynecology

## 2019-02-18 VITALS — BP 110/82 | HR 68 | Temp 97.2°F

## 2019-02-18 DIAGNOSIS — N951 Menopausal and female climacteric states: Secondary | ICD-10-CM | POA: Diagnosis not present

## 2019-02-18 DIAGNOSIS — Z8041 Family history of malignant neoplasm of ovary: Secondary | ICD-10-CM | POA: Diagnosis not present

## 2019-02-18 DIAGNOSIS — R971 Elevated cancer antigen 125 [CA 125]: Secondary | ICD-10-CM | POA: Diagnosis not present

## 2019-02-18 DIAGNOSIS — N83201 Unspecified ovarian cyst, right side: Secondary | ICD-10-CM

## 2019-02-18 DIAGNOSIS — N926 Irregular menstruation, unspecified: Secondary | ICD-10-CM

## 2019-02-18 MED ORDER — MEDROXYPROGESTERONE ACETATE 5 MG PO TABS: ORAL_TABLET | ORAL | 1 refills | Status: DC

## 2019-02-18 NOTE — Patient Instructions (Signed)

## 2019-02-19 ENCOUNTER — Other Ambulatory Visit: Payer: Self-pay | Admitting: Obstetrics and Gynecology

## 2019-02-19 DIAGNOSIS — N83201 Unspecified ovarian cyst, right side: Secondary | ICD-10-CM

## 2019-02-19 LAB — CA 125: Cancer Antigen (CA) 125: 29 U/mL (ref 0.0–38.1)

## 2019-02-26 ENCOUNTER — Telehealth: Payer: 59 | Admitting: Family

## 2019-02-26 DIAGNOSIS — R109 Unspecified abdominal pain: Secondary | ICD-10-CM

## 2019-02-26 DIAGNOSIS — R399 Unspecified symptoms and signs involving the genitourinary system: Secondary | ICD-10-CM

## 2019-02-26 NOTE — Progress Notes (Signed)
Based on what you shared with me, I feel your condition warrants further evaluation and I recommend that you be seen for a face to face office visit.  Given your symptoms of a UTI with back pain, you need to be seen for a urine culture and to rule out a more serious infection.   NOTE: If you entered your credit card information for this eVisit, you will not be charged. You may see a "hold" on your card for the $35 but that hold will drop off and you will not have a charge processed.  If you are having a true medical emergency please call 911.     For an urgent face to face visit, [facility] has four urgent care centers for your convenience:    NEW:  [facility] [place] [address], [place] [address] .  Monday - Friday 10 am - 6 pm    . [facility]    [phone]                  Get Driving Directions  [id number] [address], [place] [address] . 10 am to 8 pm Monday-Friday . 12 pm to 8 pm Saturday-Sunday   . [facility] at [place]                  Get Driving Directions  [id number] [address], [place], [place] [address] . 8 am to 8 pm Monday-Friday . 9 am to 6 pm Saturday . 11 am to 6 pm Sunday     . [facility] at [place]                  Get Driving Directions   [address], [place] [address] . 8 am to 8 pm Monday-Friday . 8 am to 4 pm Saturday-Sunday    . [facility] at [place]                    Get Driving Directions  [id number]  [address], [place], [place] [address]  . Monday-Friday, 12 PM to 6 PM    Your e-visit answers were reviewed by a board certified advanced clinical practitioner to complete your personal care plan.  Thank you for using e-Visits.

## 2019-02-27 ENCOUNTER — Ambulatory Visit (INDEPENDENT_AMBULATORY_CARE_PROVIDER_SITE_OTHER): Payer: BLUE CROSS/BLUE SHIELD | Admitting: Family Medicine

## 2019-02-27 ENCOUNTER — Other Ambulatory Visit: Payer: Self-pay

## 2019-02-27 ENCOUNTER — Encounter: Payer: Self-pay | Admitting: Family Medicine

## 2019-02-27 VITALS — BP 120/84 | HR 72 | Temp 97.5°F | Resp 16 | Ht 66.0 in | Wt 152.5 lb

## 2019-02-27 DIAGNOSIS — R109 Unspecified abdominal pain: Secondary | ICD-10-CM

## 2019-02-27 DIAGNOSIS — R8290 Unspecified abnormal findings in urine: Secondary | ICD-10-CM

## 2019-02-27 DIAGNOSIS — T148XXA Other injury of unspecified body region, initial encounter: Secondary | ICD-10-CM

## 2019-02-27 DIAGNOSIS — R10A0 Flank pain, unspecified side: Secondary | ICD-10-CM

## 2019-02-27 LAB — POCT URINALYSIS DIPSTICK
Bilirubin, UA: NEGATIVE
Blood, UA: NEGATIVE
Glucose, UA: NEGATIVE
Leukocytes, UA: NEGATIVE
Nitrite, UA: NEGATIVE
Protein, UA: NEGATIVE
Spec Grav, UA: 1.01 (ref 1.010–1.025)
Urobilinogen, UA: 0.2 E.U./dL
pH, UA: 6.5 (ref 5.0–8.0)

## 2019-02-27 MED ORDER — CEPHALEXIN 500 MG PO CAPS: 500.0000 mg | ORAL_CAPSULE | Freq: Three times a day (TID) | ORAL | 0 refills | Status: DC

## 2019-02-27 NOTE — Patient Instructions (Signed)
I called in an antibiotic for you to hold onto and start if symptoms get worse or culture is positive.  Continue to flush kidneys with water.   Your rib appears bruised. Use NSAIDS to help.

## 2019-02-27 NOTE — Progress Notes (Signed)
Mary Warren , 1965-05-19, 53 y.o., female MRN: [id number] Patient Care Team    Relationship Specialty Notifications Start End  Ma Hillock, DO PCP - General Family Medicine  10/26/16     Chief Complaint  Patient presents with  . Flank Pain    Right sided flank pain x4 days. Radiates to front. Has had strong urine smell. Pt took OTC AZO with some relief for one day.       Subjective: Pt presents for an OV with complaints of Monday sharp pain of her right flank/rib area.  She has also noted strong smelling urine.  She denies urinary frequency or urgency.  She denies fever, chills, nausea, bowel changes or vomit.  She reports she started taking Azo over the last 2 days, last dose was this morning.  She has been drinking a great deal of water to keep her kidneys flushed.  She states she did have a ovarian ultrasound last week prior to onset of symptoms.  However she states this is routine and was uncomplicated and not painful.  She also endorses going into the office for the first time last week and was carrying some boxes in a computer with her.  Does not recall an injury during that time or prior to onset of discomfort. Patient's last menstrual period was 01/23/2019 (exact date).  She reports her menstrual cycles have changed a great deal over the last few years and she does not routinely have a menstrual cycle every month.  However she did feel some mild cramping today and is uncertain if she is going to start her cycle.   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   Exercises but nor routinely.    Smoke detector in the home.   Wears her set belt.   Feels safe in her relationships.    Past Medical History:  Diagnosis Date  . Family history of ovarian cancer   . Ovarian  cyst    Past Surgical History:  Procedure Laterality Date  . ADENOIDECTOMY     Family History  Problem Relation Age of Onset  . Drug abuse Mother   . COPD Mother   . Mental retardation Mother   . Early death Mother 44       blood clot  . Ovarian cancer Mother 44       dx 'late 30-Jul-2022', died at 68 due to COPD  . Heart disease Mother   . Breast cancer Paternal Aunt 47       is now in 8's  . Alcohol abuse Sister   . Heart disease Maternal Uncle        died late 57's  . Heart attack Maternal Grandmother 52   Allergies as of 02/27/2019   No Known Allergies     Medication List       Accurate as of February 27, 2019 11:17 AM. If you have any questions, ask your nurse or doctor.        cholecalciferol 1000 units tablet Commonly known as: VITAMIN D Take by mouth daily.   medroxyPROGESTERone 5 MG tablet Commonly known as: Provera Take one tablet a day for 5 days every other month if no spontaneous menses.   multivitamin tablet Take 1 tablet  by mouth daily.   Omega 3 1000 MG Caps Take by mouth.   Probiotic Acidophilus Caps Take by mouth.   vitamin B-12 1000 MCG tablet Commonly known as: CYANOCOBALAMIN Take 1,000 mcg by mouth daily.       All past medical history, surgical history, allergies, family history, immunizations andmedications were updated in the EMR today and reviewed under the history and medication portions of their EMR.     ROS: Negative, with the exception of above mentioned in HPI   Objective:  BP 120/84 (BP Location: Left Arm, Patient Position: Sitting, Cuff Size: Normal)   Pulse 72   Temp (!) 97.5 F (36.4 C) (Temporal)   Resp 16   Ht 5\' 6"  (1.676 m)   Wt 152 lb 8 oz (69.2 kg)   LMP 01/23/2019 (Exact Date) Comment: Oct 2020  SpO2 99%   BMI 24.61 kg/m  Body mass index is 24.61 kg/m. Gen: Afebrile. No acute distress. Nontoxic in appearance, well developed, well nourished. Pleasant female.  HENT: AT. [place]. CV: RRR  Abd: Soft. NTND. BS  present MSK: TTP right lower back over ribs.  Bruising noted right flank near this location.  Bruising is in the shape of a dog's paw. Neuro: Normal gait. PERLA. EOMi. Alert. Oriented x3 Psych: Normal affect, dress and demeanor. Normal speech. Normal thought content and judgment.  No exam data present No results found. Results for orders placed or performed in visit on 02/27/19 (from the past 24 hour(s))  POCT Urinalysis Dipstick     Status: None   Collection Time: 02/27/19 11:16 AM  Result Value Ref Range   Color, UA yellow    Clarity, UA clear    Glucose, UA Negative Negative   Bilirubin, UA negative    Ketones, UA 1+    Spec Grav, UA 1.010 1.010 - 1.025   Blood, UA negative    pH, UA 6.5 5.0 - 8.0   Protein, UA Negative Negative   Urobilinogen, UA 0.2 0.2 or 1.0 E.U./dL   Nitrite, UA negative    Leukocytes, UA Negative Negative   Appearance     Odor      Assessment/Plan: Mary Warren is a 53 y.o. female present for OV for  Abnormal urine odor/flank pain/bruising -Urine culture did not appear infectious.  Will send for urine culture to be complete.  Discussed options with her and she is going out of town this weekend therefore elected to go ahead and prescribe the Keflex 3 times daily x7 days.  She will not start unless her symptoms become worse or urine culture is positive. She is tender over the right flank/rib area.  There is a bruise in the shape of a dog's paw at this location.  She states she does have dogs, but does not recall a particular injury.  She will monitor.  NSAIDs for comfort. - POCT Urinalysis Dipstick - Urine Culture -Follow-up dependent upon labs and clinical outcome.   Reviewed expectations re: course of current medical issues.  Discussed self-management of symptoms.  Outlined signs and symptoms indicating need for more acute intervention.  Patient verbalized understanding and all questions were answered.  Patient received an After-Visit Summary.     Orders Placed This Encounter  Procedures  . POCT Urinalysis Dipstick    > 25 minutes spent with patient, >50% of time spent face to face    Note is dictated utilizing voice recognition software. Although note has been proof read prior to signing, occasional typographical errors still can  be missed. If any questions arise, please do not hesitate to call for verification.   electronically signed by:  Howard Pouch, DO  [place]

## 2019-03-01 LAB — URINE CULTURE
MICRO NUMBER:: 1071074
Result:: NO GROWTH
SPECIMEN QUALITY:: ADEQUATE

## 2019-03-25 ENCOUNTER — Other Ambulatory Visit: Payer: Self-pay

## 2019-03-25 ENCOUNTER — Encounter: Payer: Self-pay | Admitting: Family Medicine

## 2019-03-25 ENCOUNTER — Telehealth: Payer: Self-pay | Admitting: Family Medicine

## 2019-03-25 ENCOUNTER — Ambulatory Visit (INDEPENDENT_AMBULATORY_CARE_PROVIDER_SITE_OTHER): Payer: BLUE CROSS/BLUE SHIELD | Admitting: Family Medicine

## 2019-03-25 VITALS — BP 124/83 | HR 60 | Temp 98.2°F | Resp 16 | Ht 68.0 in | Wt 150.1 lb

## 2019-03-25 DIAGNOSIS — Z8041 Family history of malignant neoplasm of ovary: Secondary | ICD-10-CM

## 2019-03-25 DIAGNOSIS — Z131 Encounter for screening for diabetes mellitus: Secondary | ICD-10-CM | POA: Diagnosis not present

## 2019-03-25 DIAGNOSIS — Z0001 Encounter for general adult medical examination with abnormal findings: Secondary | ICD-10-CM

## 2019-03-25 DIAGNOSIS — Z1231 Encounter for screening mammogram for malignant neoplasm of breast: Secondary | ICD-10-CM

## 2019-03-25 DIAGNOSIS — L219 Seborrheic dermatitis, unspecified: Secondary | ICD-10-CM | POA: Diagnosis not present

## 2019-03-25 DIAGNOSIS — Z1211 Encounter for screening for malignant neoplasm of colon: Secondary | ICD-10-CM

## 2019-03-25 DIAGNOSIS — Z9189 Other specified personal risk factors, not elsewhere classified: Secondary | ICD-10-CM | POA: Diagnosis not present

## 2019-03-25 DIAGNOSIS — Z130 Encounter for screening for diseases of the blood and blood-forming organs and certain disorders involving the immune mechanism: Secondary | ICD-10-CM | POA: Diagnosis not present

## 2019-03-25 DIAGNOSIS — Z13220 Encounter for screening for lipoid disorders: Secondary | ICD-10-CM

## 2019-03-25 LAB — COMPREHENSIVE METABOLIC PANEL
ALT: 12 U/L (ref 0–35)
AST: 15 U/L (ref 0–37)
Albumin: 4.4 g/dL (ref 3.5–5.2)
Alkaline Phosphatase: 44 U/L (ref 39–117)
BUN: 14 mg/dL (ref 6–23)
CO2: 27 mEq/L (ref 19–32)
Calcium: 9.6 mg/dL (ref 8.4–10.5)
Chloride: 105 mEq/L (ref 96–112)
Creatinine, Ser: 0.56 mg/dL (ref 0.40–1.20)
GFR: 113.66 mL/min (ref >60.00)
Glucose, Bld: 92 mg/dL (ref 70–99)
Potassium: 4.6 mEq/L (ref 3.5–5.1)
Sodium: 139 mEq/L (ref 135–145)
Total Bilirubin: 0.6 mg/dL (ref 0.2–1.2)
Total Protein: 6.9 g/dL (ref 6.0–8.3)

## 2019-03-25 LAB — LIPID PANEL
Cholesterol: 218 mg/dL — ABNORMAL HIGH (ref 0–200)
HDL: 46.7 mg/dL (ref >39.00)
LDL Cholesterol: 152 mg/dL — ABNORMAL HIGH (ref 0–99)
NonHDL: 171.67
Total CHOL/HDL Ratio: 5
Triglycerides: 99 mg/dL (ref 0.0–149.0)
VLDL: 19.8 mg/dL (ref 0.0–40.0)

## 2019-03-25 LAB — HEMOGLOBIN A1C: Hgb A1c MFr Bld: 5.4 % (ref 4.6–6.5)

## 2019-03-25 LAB — CBC
HCT: 38.7 % (ref 36.0–46.0)
Hemoglobin: 12.9 g/dL (ref 12.0–15.0)
MCHC: 33.4 g/dL (ref 30.0–36.0)
MCV: 94.1 fl (ref 78.0–100.0)
Platelets: 233 10*3/uL (ref 150.0–400.0)
RBC: 4.11 Mil/uL (ref 3.87–5.11)
RDW: 14 % (ref 11.5–15.5)
WBC: 4.5 10*3/uL (ref 4.0–10.5)

## 2019-03-25 MED ORDER — KETOCONAZOLE 2 % EX SHAM: MEDICATED_SHAMPOO | CUTANEOUS | 5 refills | Status: DC

## 2019-03-25 NOTE — Telephone Encounter (Signed)
Please inform patient the following information: Her labs all look great with the exception of an elevation in her cholesterol level. Her liver, kidney, electrolytes, blood counts, thyroid and diabetes screen are all normal. Her cholesterol panel itself looks good with the exception of her LDL which is the bad cholesterol has increased to 152.  LDL greater than 160, even in a patient with no other risk factors, would be advised to start a statin.  With her family history of heart disease, she should have a goal of LDL less than 130. (If patient asks: LDL is not affected by fasting or nonfasting state) -Recommendations are routine exercise and dietary modifications.  I would recommend a mediterranean diet.  A mediterranean diet is high in fruits, vegetables, whole grains, fish, chicken, nuts, healthy fats (olive oil or canola oil). Low fat dairy. There are many online resources and books on this diet. Limit butter, margarine, red meat and sweets.  Follow-up in 3 months with provider, with fasting labs that day.  If still elevated at that time, would recommend a start of a low-dose statin for cardiovascular protection.   Lastly, I did call in the ketoconazole shampoo with directions on the package to be used 2 times a week for 2 to 4 weeks.  We can touch base on this problem as well at her 3-2-month follow-up on her cholesterol-sooner if use after 4 weeks she does not see improvement.

## 2019-03-25 NOTE — Progress Notes (Signed)
Patient ID: Mary Warren, female  DOB: 05-31-1965, 53 y.o.   MRN: [id number] Patient Care Team    Relationship Specialty Notifications Start End  Ma Hillock, DO PCP - General Family Medicine  10/26/16   Salvadore Dom, MD Consulting Physician Obstetrics and Gynecology  03/25/19     Chief Complaint  Patient presents with   Annual Exam    Fasting. Mammogram and pap 12-Jul-2015. Pt does not want to get pap smear today due to being on menstural cycle     Subjective:  Mary Warren is a 53 y.o.  Female  present for CPE. All past medical history, surgical history, allergies, family history, immunizations, medications and social history were updated in the electronic medical record today. All recent labs, ED visits and hospitalizations within the last year were reviewed.  Pt complains of a dry scalp today. She reports for a few years she has been battling dry flaky scalp that is worse around hairline of ears and neck. She has tried OTC shampoos for dandruff and has resorted to using olive oil on her scalp. She is wondering if there is anything that can be prescribed.   Health maintenance: Updated 08/07/2016. Colonoscopy: No FHX, routine screen 51>> cologuard ordered Mammogram: FH in Pat aunt 07/12/58), Ovarian cancer (<30), pts  mammogram normal 09/2015, pt has "reservations of radiation exposure". Still encouraged yearly mammograms giving family history.>> ordered today Cervical cancer screening: Ovarian cancer in mother. Pap smear 07/12/15 normal with negative HPV by Dr. Raoul Pitch. pt now est w/ GYN- Dr/ Talbert Nan Immunizations: Up-to-date with tetanus shot 2010/07/12, declines flu vaccination.shingrix declined Infectious disease screening: HIV completed 07-12-2010 DEXA: N/A Assistive device: none Oxygen SXJ:DBZM Patient has a Dental home. Hospitalizations/ED visits: reviewed   Depression screen [facility] 2/9 03/25/2019 08/07/2016 09/07/2015  Decreased Interest 0 0 0  Down, Depressed, Hopeless 0 0 0  PHQ - 2  Score 0 0 0   No flowsheet data found.   There is no immunization history on file for this patient.   Past Medical History:  Diagnosis Date   Family history of ovarian cancer    Ovarian cyst    No Known Allergies Past Surgical History:  Procedure Laterality Date   ADENOIDECTOMY     Family History  Problem Relation Age of Onset   Drug abuse Mother    COPD Mother    Mental retardation Mother    Early death Mother 63       blood clot   Ovarian cancer Mother 63       dx 20late 07/12/2022', died at 11 due to COPD   Heart disease Mother    Breast cancer Paternal Aunt 77       is now in 23's   Alcohol abuse Sister    Heart disease Maternal Uncle        died late 35's   Heart attack Maternal Grandmother 52   Social History   Social History Narrative   Husband, Geneticist, molecular. No children.   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   Exercises but nor routinely.    Smoke detector in the home.   Wears her set belt.   Feels safe in her relationships.     Allergies as of 03/25/2019   No Known Allergies     Medication List       Accurate as of March 25, 2019 10:07 AM. If you have any questions, ask your nurse or doctor.        STOP taking  these medications   cephALEXin 500 MG capsule Commonly known as: KEFLEX Stopped by: Howard Pouch, DO     TAKE these medications   cholecalciferol 1000 units tablet Commonly known as: VITAMIN D Take by mouth daily.   medroxyPROGESTERone 5 MG tablet Commonly known as: Provera Take one tablet a day for 5 days every other month if no spontaneous menses.   multivitamin tablet Take 1 tablet by mouth daily.   Omega 3 1000 MG Caps Take by mouth.   Probiotic Acidophilus Caps Take by mouth.   vitamin B-12 1000 MCG tablet Commonly known as: CYANOCOBALAMIN Take 1,000 mcg by mouth daily.       All past medical history, surgical history, allergies, family history, immunizations andmedications were updated in the EMR today and  reviewed under the history and medication portions of their EMR.      ROS: 14 pt review of systems performed and negative (unless mentioned in an HPI)  Objective: BP 124/83 (BP Location: Left Arm, Patient Position: Sitting, Cuff Size: Normal)    Pulse 60    Temp 98.2 F (36.8 C) (Temporal)    Resp 16    Ht '5\' 8"'  (1.727 m)    Wt 150 lb 2 oz (68.1 kg)    LMP 03/22/2019 (Exact Date)    SpO2 98%    BMI 22.83 kg/m  Gen: Afebrile. No acute distress. Nontoxic in appearance, well-developed, well-nourished,  Pleasant caucasian female.  HENT: AT. [place]. Bilateral TM visualized and normal in appearance, normal external auditory canal. MMM, no oral lesions, adequate dentition. Bilateral nares within normal limits. Throat without erythema, ulcerations or exudates. no Cough on exam, no hoarseness on exam. Eyes:Pupils Equal Round Reactive to light, Extraocular movements intact,  Conjunctiva without redness, discharge or icterus. Neck/lymp/endocrine: Supple,no lymphadenopathy, no thyromegaly CV: RRR no murmur, no edema, +2/4 P posterior tibialis pulses. no carotid bruits. No JVD. Chest: CTAB, no wheeze, rhonchi or crackles. normal Respiratory effort. good Air movement. Abd: Soft. flat. NTND. BS present. no Masses palpated. No hepatosplenomegaly. No rebound tenderness or guarding. Skin: no rashes, purpura or petechiae. Warm and well-perfused. Skin intact. Dry falky scalp > right hairline by ear and neck.  Neuro/Msk:  Normal gait. PERLA. EOMi. Alert. Oriented x3.  Cranial nerves II through XII intact. Muscle strength 5/5 upper/lower extremity. DTRs equal bilaterally. Psych: Normal affect, dress and demeanor. Normal speech. Normal thought content and judgment.  No exam data present  Assessment/plan: Mary Warren is a 53 y.o. female present for CPE Family history of ovarian cancer/High risk of ovarian cancer Follow with Dr. Talbert Nan. She will have her GYN preform her PAPs.  Lipid screening - Lipid  panel Screening for deficiency anemia - CBC Diabetes mellitus screening - Comp Met (CMET) - HgB A1c Encounter for screening mammogram for malignant neoplasm of breast - MM 3D SCREEN BREAST BILATERAL; Future Colon cancer screening - Cologuard Seborrheic dermatitis of scalp Discussed options with her today. Trial of ketoconazole shampoo 2x/wk for 2-4 weeks. Refills provided.  Pt to use only for 2-4 weeks. May repeat if necessary for acute flares.  If needing additional coverage, could topical steroid use as well.  F/u in 4-6 weeks.   Encounter for general adult medical examination with abnormal findings Patient was encouraged to exercise greater than 150 minutes a week. Patient was encouraged to choose a diet filled with fresh fruits and vegetables, and lean meats. AVS provided to patient today for education/recommendation on gender specific health and safety maintenance. Colonoscopy: No FHX, routine  screen 50>> cologuard ordered Mammogram: FH in Pat aunt (60), Ovarian cancer (<30), pts  mammogram normal 09/2015, pt has "reservations of radiation exposure". Still encouraged yearly mammograms giving family history.>> ordered today Cervical cancer screening: Ovarian cancer in mother. Pap smear 2017 normal with negative HPV by Dr. Raoul Pitch. pt now est w/ GYN- Dr/ Talbert Nan Immunizations: Up-to-date with tetanus shot 2012, declines flu vaccination.shingrix declined Infectious disease screening: HIV completed 2012 DEXA: N/A   Return in about 1 year (around 03/24/2020) for CPE (30 min).  Orders Placed This Encounter  Procedures   MM 3D SCREEN BREAST BILATERAL   CBC   Comp Met (CMET)   HgB A1c   Lipid panel   Cologuard   Annual physical completed today in addition to > 10 minutes spent with patient, > 50% of that time face to face addressing acute concerns.     Electronically signed by: Howard Pouch, DO [place]

## 2019-03-25 NOTE — Patient Instructions (Signed)
Health Maintenance, Female Adopting a healthy lifestyle and getting preventive care are important in promoting health and wellness. Ask your health care provider about:  The right schedule for you to have regular tests and exams.  Things you can do on your own to prevent diseases and keep yourself healthy. What should I know about diet, weight, and exercise? Eat a healthy diet   Eat a diet that includes plenty of vegetables, fruits, low-fat dairy products, and lean protein.  Do not eat a lot of foods that are high in solid fats, added sugars, or sodium. Maintain a healthy weight Body mass index (BMI) is used to identify weight problems. It estimates body fat based on height and weight. Your health care provider can help determine your BMI and help you achieve or maintain a healthy weight. Get regular exercise Get regular exercise. This is one of the most important things you can do for your health. Most adults should:  Exercise for at least 150 minutes each week. The exercise should increase your heart rate and make you sweat (moderate-intensity exercise).  Do strengthening exercises at least twice a week. This is in addition to the moderate-intensity exercise.  Spend less time sitting. Even light physical activity can be beneficial. Watch cholesterol and blood lipids Have your blood tested for lipids and cholesterol at 52 years of age, then have this test every 5 years. Have your cholesterol levels checked more often if:  Your lipid or cholesterol levels are high.  You are older than 52 years of age.  You are at high risk for heart disease. What should I know about cancer screening? Depending on your health history and family history, you may need to have cancer screening at various ages. This may include screening for:  Breast cancer.  Cervical cancer.  Colorectal cancer.  Skin cancer.  Lung cancer. What should I know about heart disease, diabetes, and high blood  pressure? Blood pressure and heart disease  High blood pressure causes heart disease and increases the risk of stroke. This is more likely to develop in people who have high blood pressure readings, are of African descent, or are overweight.  Have your blood pressure checked: ? Every 3-5 years if you are 18-39 years of age. ? Every year if you are 40 years old or older. Diabetes Have regular diabetes screenings. This checks your fasting blood sugar level. Have the screening done:  Once every three years after age 40 if you are at a normal weight and have a low risk for diabetes.  More often and at a younger age if you are overweight or have a high risk for diabetes. What should I know about preventing infection? Hepatitis B If you have a higher risk for hepatitis B, you should be screened for this virus. Talk with your health care provider to find out if you are at risk for hepatitis B infection. Hepatitis C Testing is recommended for:  Everyone born from 1945 through 1965.  Anyone with known risk factors for hepatitis C. Sexually transmitted infections (STIs)  Get screened for STIs, including gonorrhea and chlamydia, if: ? You are sexually active and are younger than 52 years of age. ? You are older than 52 years of age and your health care provider tells you that you are at risk for this type of infection. ? Your sexual activity has changed since you were last screened, and you are at increased risk for chlamydia or gonorrhea. Ask your health care provider if   you are at risk.  Ask your health care provider about whether you are at high risk for HIV. Your health care provider may recommend a prescription medicine to help prevent HIV infection. If you choose to take medicine to prevent HIV, you should first get tested for HIV. You should then be tested every 3 months for as long as you are taking the medicine. Pregnancy  If you are about to stop having your period (premenopausal) and  you may become pregnant, seek counseling before you get pregnant.  Take 400 to 800 micrograms (mcg) of folic acid every day if you become pregnant.  Ask for birth control (contraception) if you want to prevent pregnancy. Osteoporosis and menopause Osteoporosis is a disease in which the bones lose minerals and strength with aging. This can result in bone fractures. If you are 65 years old or older, or if you are at risk for osteoporosis and fractures, ask your health care provider if you should:  Be screened for bone loss.  Take a calcium or vitamin D supplement to lower your risk of fractures.  Be given hormone replacement therapy (HRT) to treat symptoms of menopause. Follow these instructions at home: Lifestyle  Do not use any products that contain nicotine or tobacco, such as cigarettes, e-cigarettes, and chewing tobacco. If you need help quitting, ask your health care provider.  Do not use street drugs.  Do not share needles.  Ask your health care provider for help if you need support or information about quitting drugs. Alcohol use  Do not drink alcohol if: ? Your health care provider tells you not to drink. ? You are pregnant, may be pregnant, or are planning to become pregnant.  If you drink alcohol: ? Limit how much you use to 0-1 drink a day. ? Limit intake if you are breastfeeding.  Be aware of how much alcohol is in your drink. In the [place] one drink equals one 12 oz bottle of beer (355 mL), one 5 oz glass of wine (148 mL), or one 1 oz glass of hard liquor (44 mL). General instructions  Schedule regular health, dental, and eye exams.  Stay current with your vaccines.  Tell your health care provider if: ? You often feel depressed. ? You have ever been abused or do not feel safe at home. Summary  Adopting a healthy lifestyle and getting preventive care are important in promoting health and wellness.  Follow your health care provider's instructions about healthy  diet, exercising, and getting tested or screened for diseases.  Follow your health care provider's instructions on monitoring your cholesterol and blood pressure. This information is not intended to replace advice given to you by your health care provider. Make sure you discuss any questions you have with your health care provider. Document Released: 10/24/2010 Document Revised: 04/03/2018 Document Reviewed: 04/03/2018 Elsevier Patient Education  2020 [employer]  

## 2019-03-26 NOTE — Telephone Encounter (Signed)
Pt was called and given lab results, she verbalized understanding. Appt was made for 3 month F/U

## 2019-05-20 ENCOUNTER — Ambulatory Visit (INDEPENDENT_AMBULATORY_CARE_PROVIDER_SITE_OTHER): Payer: BLUE CROSS/BLUE SHIELD | Admitting: Family Medicine

## 2019-05-20 ENCOUNTER — Other Ambulatory Visit: Payer: Self-pay

## 2019-05-20 ENCOUNTER — Telehealth: Payer: Self-pay

## 2019-05-20 ENCOUNTER — Encounter: Payer: Self-pay | Admitting: Family Medicine

## 2019-05-20 VITALS — HR 70 | Temp 98.3°F | Ht 68.0 in

## 2019-05-20 DIAGNOSIS — R0602 Shortness of breath: Secondary | ICD-10-CM

## 2019-05-20 DIAGNOSIS — U071 COVID-19: Secondary | ICD-10-CM

## 2019-05-20 DIAGNOSIS — J1282 Pneumonia due to coronavirus disease 2019: Secondary | ICD-10-CM | POA: Diagnosis not present

## 2019-05-20 MED ORDER — ALBUTEROL SULFATE HFA 108 (90 BASE) MCG/ACT IN AERS: 1.0000 | INHALATION_SPRAY | RESPIRATORY_TRACT | 1 refills | Status: DC | PRN

## 2019-05-20 NOTE — Patient Instructions (Signed)
COVID-19 COVID-19 is a respiratory infection that is caused by a virus called severe acute respiratory syndrome coronavirus 2 (SARS-CoV-2). The disease is also known as coronavirus disease or novel coronavirus. In some people, the virus may not cause any symptoms. In others, it may cause a serious infection. The infection can get worse quickly and can lead to complications, such as:  Pneumonia, or infection of the lungs.  Acute respiratory distress syndrome or ARDS. This is a condition in which fluid build-up in the lungs prevents the lungs from filling with air and passing oxygen into the blood.  Acute respiratory failure. This is a condition in which there is not enough oxygen passing from the lungs to the body or when carbon dioxide is not passing from the lungs out of the body.  Sepsis or septic shock. This is a serious bodily reaction to an infection.  Blood clotting problems.  Secondary infections due to bacteria or fungus.  Organ failure. This is when your body's organs stop working. The virus that causes COVID-19 is contagious. This means that it can spread from person to person through droplets from coughs and sneezes (respiratory secretions). What are the causes? This illness is caused by a virus. You may catch the virus by:  Breathing in droplets from an infected person. Droplets can be spread by a person breathing, speaking, singing, coughing, or sneezing.  Touching something, like a table or a doorknob, that was exposed to the virus (contaminated) and then touching your mouth, nose, or eyes. What increases the risk? Risk for infection You are more likely to be infected with this virus if you:  Are within 6 feet (2 meters) of a person with COVID-19.  Provide care for or live with a person who is infected with COVID-19.  Spend time in crowded indoor spaces or live in shared housing. Risk for serious illness You are more likely to become seriously ill from the virus if you:   Are 50 years of age or older. The higher your age, the more you are at risk for serious illness.  Live in a nursing home or long-term care facility.  Have cancer.  Have a long-term (chronic) disease such as: ? Chronic lung disease, including chronic obstructive pulmonary disease or asthma. ? A long-term disease that lowers your body's ability to fight infection (immunocompromised). ? Heart disease, including heart failure, a condition in which the arteries that lead to the heart become narrow or blocked (coronary artery disease), a disease which makes the heart muscle thick, weak, or stiff (cardiomyopathy). ? Diabetes. ? Chronic kidney disease. ? Sickle cell disease, a condition in which red blood cells have an abnormal "sickle" shape. ? Liver disease.  Are obese. What are the signs or symptoms? Symptoms of this condition can range from mild to severe. Symptoms may appear any time from 2 to 14 days after being exposed to the virus. They include:  A fever or chills.  A cough.  Difficulty breathing.  Headaches, body aches, or muscle aches.  Runny or stuffy (congested) nose.  A sore throat.  New loss of taste or smell. Some people may also have stomach problems, such as nausea, vomiting, or diarrhea. Other people may not have any symptoms of COVID-19. How is this diagnosed? This condition may be diagnosed based on:  Your signs and symptoms, especially if: ? You live in an area with a COVID-19 outbreak. ? You recently traveled to or from an area where the virus is common. ? You   provide care for or live with a person who was diagnosed with COVID-19. ? You were exposed to a person who was diagnosed with COVID-19.  A physical exam.  Lab tests, which may include: ? Taking a sample of fluid from the back of your nose and throat (nasopharyngeal fluid), your nose, or your throat using a swab. ? A sample of mucus from your lungs (sputum). ? Blood tests.  Imaging tests, which  may include, X-rays, CT scan, or ultrasound. How is this treated? At present, there is no medicine to treat COVID-19. Medicines that treat other diseases are being used on a trial basis to see if they are effective against COVID-19. Your health care provider will talk with you about ways to treat your symptoms. For most people, the infection is mild and can be managed at home with rest, fluids, and over-the-counter medicines. Treatment for a serious infection usually takes places in a hospital intensive care unit (ICU). It may include one or more of the following treatments. These treatments are given until your symptoms improve.  Receiving fluids and medicines through an IV.  Supplemental oxygen. Extra oxygen is given through a tube in the nose, a face mask, or a hood.  Positioning you to lie on your stomach (prone position). This makes it easier for oxygen to get into the lungs.  Continuous positive airway pressure (CPAP) or bi-level positive airway pressure (BPAP) machine. This treatment uses mild air pressure to keep the airways open. A tube that is connected to a motor delivers oxygen to the body.  Ventilator. This treatment moves air into and out of the lungs by using a tube that is placed in your windpipe.  Tracheostomy. This is a procedure to create a hole in the neck so that a breathing tube can be inserted.  Extracorporeal membrane oxygenation (ECMO). This procedure gives the lungs a chance to recover by taking over the functions of the heart and lungs. It supplies oxygen to the body and removes carbon dioxide. Follow these instructions at home: Lifestyle  If you are sick, stay home except to get medical care. Your health care provider will tell you how long to stay home. Call your health care provider before you go for medical care.  Rest at home as told by your health care provider.  Do not use any products that contain nicotine or tobacco, such as cigarettes, e-cigarettes, and  chewing tobacco. If you need help quitting, ask your health care provider.  Return to your normal activities as told by your health care provider. Ask your health care provider what activities are safe for you. General instructions  Take over-the-counter and prescription medicines only as told by your health care provider.  Drink enough fluid to keep your urine pale yellow.  Keep all follow-up visits as told by your health care provider. This is important. How is this prevented?  There is no vaccine to help prevent COVID-19 infection. However, there are steps you can take to protect yourself and others from this virus. To protect yourself:   Do not travel to areas where COVID-19 is a risk. The areas where COVID-19 is reported change often. To identify high-risk areas and travel restrictions, check the CDC travel website: wwwnc.cdc.gov/travel/notices  If you live in, or must travel to, an area where COVID-19 is a risk, take precautions to avoid infection. ? Stay away from people who are sick. ? Wash your hands often with soap and water for 20 seconds. If soap and water   are not available, use an alcohol-based hand sanitizer. ? Avoid touching your mouth, face, eyes, or nose. ? Avoid going out in public, follow guidance from your state and local health authorities. ? If you must go out in public, wear a cloth face covering or face mask. Make sure your mask covers your nose and mouth. ? Avoid crowded indoor spaces. Stay at least 6 feet (2 meters) away from others. ? Disinfect objects and surfaces that are frequently touched every day. This may include:  Counters and tables.  Doorknobs and light switches.  Sinks and faucets.  Electronics, such as phones, remote controls, keyboards, computers, and tablets. To protect others: If you have symptoms of COVID-19, take steps to prevent the virus from spreading to others.  If you think you have a COVID-19 infection, contact your health care  provider right away. Tell your health care team that you think you may have a COVID-19 infection.  Stay home. Leave your house only to seek medical care. Do not use public transport.  Do not travel while you are sick.  Wash your hands often with soap and water for 20 seconds. If soap and water are not available, use alcohol-based hand sanitizer.  Stay away from other members of your household. Let healthy household members care for children and pets, if possible. If you have to care for children or pets, wash your hands often and wear a mask. If possible, stay in your own room, separate from others. Use a different bathroom.  Make sure that all people in your household wash their hands well and often.  Cough or sneeze into a tissue or your sleeve or elbow. Do not cough or sneeze into your hand or into the air.  Wear a cloth face covering or face mask. Make sure your mask covers your nose and mouth. Where to find more information  Centers for Disease Control and Prevention: www.cdc.gov/coronavirus/2019-ncov/index.html  World Health Organization: www.who.int/health-topics/coronavirus Contact a health care provider if:  You live in or have traveled to an area where COVID-19 is a risk and you have symptoms of the infection.  You have had contact with someone who has COVID-19 and you have symptoms of the infection. Get help right away if:  You have trouble breathing.  You have pain or pressure in your chest.  You have confusion.  You have bluish lips and fingernails.  You have difficulty waking from sleep.  You have symptoms that get worse. These symptoms may represent a serious problem that is an emergency. Do not wait to see if the symptoms will go away. Get medical help right away. Call your local emergency services (911 in the [place] Do not drive yourself to the hospital. Let the emergency medical personnel know if you think you have COVID-19. Summary  COVID-19 is a  respiratory infection that is caused by a virus. It is also known as coronavirus disease or novel coronavirus. It can cause serious infections, such as pneumonia, acute respiratory distress syndrome, acute respiratory failure, or sepsis.  The virus that causes COVID-19 is contagious. This means that it can spread from person to person through droplets from breathing, speaking, singing, coughing, or sneezing.  You are more likely to develop a serious illness if you are 50 years of age or older, have a weak immune system, live in a nursing home, or have chronic disease.  There is no medicine to treat COVID-19. Your health care provider will talk with you about ways to treat your symptoms.    Take steps to protect yourself and others from infection. Wash your hands often and disinfect objects and surfaces that are frequently touched every day. Stay away from people who are sick and wear a mask if you are sick. This information is not intended to replace advice given to you by your health care provider. Make sure you discuss any questions you have with your health care provider. Document Revised: 02/07/2019 Document Reviewed: 05/16/2018 Elsevier Patient Education  2020 [employer]  

## 2019-05-20 NOTE — Progress Notes (Signed)
VIRTUAL VISIT VIA VIDEO  I connected with Mary Warren on 05/20/19 at 10:30 AM EST by a video enabled telemedicine application and verified that I am speaking with the correct person using two identifiers. Location patient: Home Location provider: [facility], Office Persons participating in the virtual visit: Patient, Dr. Raoul Pitch and R.Baker, LPN  I discussed the limitations of evaluation and management by telemedicine and the availability of in person appointments. The patient expressed understanding and agreed to proceed.   SUBJECTIVE Chief Complaint  Patient presents with   Follow-up    Pt states she is getting better little by little. She has SOB on exertion but not when sitting. No longer running fever. Taking Sudafed and alternating Advil and Tylenol. Pt does state Sudafed has Tylenol in it also. O2 89-91% at rest. Ambulating q2hrs.     HPI: Mary Warren is a 54 y.o. female present for covid infection/PNA.  Patient reports her husband had tested positive for Covid.  She started to have symptoms on May 09, 2019.  She originally was seen in the emergency room on January 21 with positive Covid test.  She had a CTA which was negative for pulmonary embolism, but positive for pneumonia secondary to Covid with bilateral infiltrates, >  right lower lobe.  She was started on doxycycline and has been taking doxycycline twice daily for the past 5-1/2 days.  She has been monitoring her oxygen levels and reports they are normally around 90-92% but they have dropped to 89% resting.  She states she has been trying to get up and walk every few hours.  She is feeling mildly better today than prior.  She states she has not had a fever since the 21st.  However she is taking Advil and Tylenol around-the-clock.  She is also taking Sudafed.  Overall she reports she is feeling better and even tried to do some work from home on the computer today.  She is worried that her oxygenation levels are not  increasing. Body mass index is 22.83 kg/m.  ROS: See pertinent positives and negatives per HPI.  Patient Active Problem List   Diagnosis Date Noted   Seborrheic dermatitis of scalp 03/25/2019   Genetic testing 11/08/2016   Family history of ovarian cancer    Endometrioma of ovary 09/08/2016   High risk of ovarian cancer 08/07/2016   Encounter for preventive health examination 09/07/2015   Skin lesion of breast 09/07/2015    Social History   Tobacco Use   Smoking status: Never Smoker   Smokeless tobacco: Never Used  Substance Use Topics   Alcohol use: Yes    Alcohol/week: 2.0 - 3.0 standard drinks    Types: 2 - 3 Glasses of wine per week    Current Outpatient Medications:    cholecalciferol (VITAMIN D) 1000 units tablet, Take by mouth daily. , Disp: , Rfl:    doxycycline (VIBRAMYCIN) 100 MG capsule, Take by mouth 2 (two) times daily. For ten days, Disp: , Rfl:    ketoconazole (NIZORAL) 2 % shampoo, Use 5-10 ml to shampoo hair every 3 days (2x week). Leave shampoo on hair for 5 minutes, then rinse thoroughly. Use for 2-4 weeks at a time only to treat., Disp: 120 mL, Rfl: 5   Lactobacillus (PROBIOTIC ACIDOPHILUS) CAPS, Take by mouth., Disp: , Rfl:    medroxyPROGESTERone (PROVERA) 5 MG tablet, Take one tablet a day for 5 days every other month if no spontaneous menses., Disp: 15 tablet, Rfl: 1   Multiple  Vitamin (MULTIVITAMIN) tablet, Take 1 tablet by mouth daily., Disp: , Rfl:    Omega 3 1000 MG CAPS, Take by mouth., Disp: , Rfl:    Pseudoephedrine HCl (SUDAFED CONGESTION PO), Take by mouth., Disp: , Rfl:    vitamin B-12 (CYANOCOBALAMIN) 1000 MCG tablet, Take 1,000 mcg by mouth daily., Disp: , Rfl:   No Known Allergies  OBJECTIVE: Pulse 70    Temp 98.3 F (36.8 C) (Temporal)    Ht 5\' 8"  (1.727 m)    SpO2 91%    BMI 22.83 kg/m  Gen: Afebrile.  Appears winded.  Pauses after 2-3 words to breathe. HENT: AT. [place].  MMM.  Eyes:Pupils Equal Round Reactive to  light, Extraocular movements intact,  Conjunctiva without redness, discharge or icterus. CV: No edema Chest: Cough not present, mild shortness of breath present with speaking Skin: No rashes, purpura or petechiae.  Neuro: Alert. Oriented x3  Psych: Normal affect, dress and demeanor. Normal speech. Normal thought content and judgment.  ASSESSMENT AND PLAN: Mary Warren is a 54 y.o. female present for  Pneumonia due to COVID-19 virus/Shortness of breath Lengthy discussion with patient and her husband.  She appeared short of breath on virtual visit today.  I do have concerns she may need additional treatment. She does not appear to meet criteria for COVID-19 infusion given age and lack of comorbidities. I have encouraged her to stop the Sudafed and start Mucinex. Continue doxycycline treatment per ED. CTA was negative for PE.  Patient desired to start 81 mg of baby aspirin.  Reassured her that would be okay to start baby aspirin. Albuterol inhaler prescribed.  Albuterol 1 to 2 puffs every 4-6 hours.  If needing more frequent uses or albuterol does not improve her shortness of breath she was encouraged to seek emergent treatment. Pulse ox continues to be less than 92% resting after albuterol use, or less than 88% with ambulation would encourage her to seek emergent treatment. Encouraged patient to rest, hydrate and focus on nutrition and becoming well. Patient became tearful when advising her to avoid even working from home for the next week.  After appointment, patient called back before close and stated her pulse ox was 90% resting after albuterol.  She was encouraged to seek emergent evaluation.  Her pulse oximetry readings are worsening and she is more short of breath than when evaluated on the 21st in the emergency room.     Howard Pouch, DO 05/20/2019

## 2019-05-20 NOTE — Telephone Encounter (Signed)
Pt called and said she wanted to report her Temp was at 98 degrees F, x5 hrs without Tylenol, O2 at 90% and HR at 75, all resting rates. Dr Raoul Pitch made aware and pt was told she did not qualify for infusion. Dr Raoul Pitch advised if pt was SOB she should report to ED. Pt said she does not and is not as bad as she was this AM. This nurse heard pt speaking and was SOB on the phone, pt was advised it did sound like she was SOB and needed to go to ED to be evaluated. She verbalized understanding

## 2019-05-21 DIAGNOSIS — J1282 Pneumonia due to coronavirus disease 2019: Secondary | ICD-10-CM

## 2019-05-21 DIAGNOSIS — U071 COVID-19: Secondary | ICD-10-CM | POA: Insufficient documentation

## 2019-05-21 HISTORY — DX: COVID-19: U07.1

## 2019-05-21 HISTORY — DX: Pneumonia due to coronavirus disease 2019: J12.82

## 2019-05-23 ENCOUNTER — Ambulatory Visit: Payer: BLUE CROSS/BLUE SHIELD | Admitting: Family Medicine

## 2019-05-23 MED ORDER — ACETAMINOPHEN 325 MG PO TABS
650.00 | ORAL_TABLET | ORAL | Status: DC
Start: ? — End: 2019-05-23

## 2019-05-23 MED ORDER — ENOXAPARIN SODIUM 30 MG/0.3ML ~~LOC~~ SOLN
0.50 | SUBCUTANEOUS | Status: DC
Start: 2019-05-25 — End: 2019-05-23

## 2019-05-23 MED ORDER — DEXAMETHASONE 4 MG PO TABS
6.00 | ORAL_TABLET | ORAL | Status: DC
Start: 2019-05-26 — End: 2019-05-23

## 2019-05-23 MED ORDER — SODIUM CHLORIDE 0.9 % IV SOLN
10.00 | INTRAVENOUS | Status: DC
Start: ? — End: 2019-05-23

## 2019-05-23 MED ORDER — GENERIC EXTERNAL MEDICATION
Status: DC
Start: ? — End: 2019-05-23

## 2019-05-23 MED ORDER — ALBUTEROL SULFATE HFA 108 (90 BASE) MCG/ACT IN AERS
2.00 | INHALATION_SPRAY | RESPIRATORY_TRACT | Status: DC
Start: ? — End: 2019-05-23

## 2019-05-23 MED ORDER — ONDANSETRON HCL 4 MG/2ML IJ SOLN
4.00 | INTRAMUSCULAR | Status: DC
Start: ? — End: 2019-05-23

## 2019-05-23 MED ORDER — GENERIC EXTERNAL MEDICATION
100.00 | Status: DC
Start: 2019-05-23 — End: 2019-05-23

## 2019-05-23 MED ORDER — SODIUM CHLORIDE 0.9 % IV SOLN
30.00 | INTRAVENOUS | Status: DC
Start: 2019-05-25 — End: 2019-05-23

## 2019-05-26 MED ORDER — GENERIC EXTERNAL MEDICATION
Status: DC
Start: ? — End: 2019-05-26

## 2019-05-28 ENCOUNTER — Telehealth: Payer: Self-pay

## 2019-05-28 NOTE — Telephone Encounter (Signed)
Patient has made hospital follow up visit for 05/29/19.  Patient will be alone this evening, she is concerned that her pulse rate has been dropping down to low 40's in the evening. Please call patient.

## 2019-05-28 NOTE — Telephone Encounter (Signed)
Pt was called and she said at the hospital commented on her HR being low. Pt feels fine, denies SOB. Pt has been waking up with night sweats and then that is when heart rate is in the upper 40's and lower 50's. Pt admitted to only drinking one cup of Gatorade and one glass of tea daily. Pt was encouraged to increase fluid intake with water. 60-80 oz daily if able. She was advised that if she had SOB, chest pain, any new symptoms to seek emergency help. Pt verbalized understanding.

## 2019-05-29 ENCOUNTER — Encounter: Payer: Self-pay | Admitting: Family Medicine

## 2019-05-29 ENCOUNTER — Ambulatory Visit (INDEPENDENT_AMBULATORY_CARE_PROVIDER_SITE_OTHER): Payer: BLUE CROSS/BLUE SHIELD | Admitting: Family Medicine

## 2019-05-29 ENCOUNTER — Other Ambulatory Visit: Payer: Self-pay

## 2019-05-29 VITALS — HR 71 | Ht 68.0 in

## 2019-05-29 DIAGNOSIS — Z9289 Personal history of other medical treatment: Secondary | ICD-10-CM

## 2019-05-29 DIAGNOSIS — J1282 Pneumonia due to coronavirus disease 2019: Secondary | ICD-10-CM

## 2019-05-29 DIAGNOSIS — U071 COVID-19: Secondary | ICD-10-CM | POA: Diagnosis not present

## 2019-05-29 NOTE — Progress Notes (Signed)
Mary Warren , [birth year], 54 y.o., female MRN: [id number] Patient Care Team    Relationship Specialty Notifications Start End  Ma Hillock, DO PCP - General Family Medicine  10/26/16   Salvadore Dom, MD Consulting Physician Obstetrics and Gynecology  03/25/19     Chief Complaint  Patient presents with  . Hospitalization Follow-up    Pt is doing well but is concerned with her heart rate dropping at night. Pulse was running in the 50's and droped to 48 at one point.      Subjective:  Mary Warren  is a 54 y.o. female presents for hospital follow up after recent admission on 05/21/2019 for primary diagnosis ammonia secondary to COVID-19.  Patient was discharged on 05/25/2019 to home. Patients discharge summary has been reviewed, as well as all labs/image studies obtained during hospitalization.   Patients hospital course: Patient presented to the emergency room per instruction of this provider secondary to increasing shortness of breath/work of breath during her virtual video follow-up after diagnosed with COVID-19 on January 21.  Patient first started having symptoms of an upper respiratory infection on January 15, was seen in an emergency room and tested positive on the 21st.  On virtual follow-up she appeared extremely winded and was encouraged to report to the emergency room.  His oxygen saturations were in the 80s at rest.  He was provided with 4 L O2 nasal cannula.  Laboratory evaluation was positive for elevated D-dimer of 1.98, WBC 6.8, hemoglobin 10.9, potassium was low at 3.2, sodium 140, EKG with a heart rate of 80 bpm without ischemic changes.  Patient was admitted to the hospital for COVID-19 pneumonia with acute respiratory failure and started on antiviral therapy and Decadron.  She was discharged home with albuterol inhaler, incentive spirometer, Decadron 5-day course and 30-day supply of Eliquis.  Since hospital discharge patient reports overall she is feeling improved  but still fatigued.  She has been resting since being at home.  She is almost finished with her steroid medication.  The hospitalist has written her out of work until February 10.  She has some concerns secondary to heart rate in the 50s routinely when she is sleeping.  She states it also got down to 46 once.  She denies any symptoms with lower heart rate at night.  She states it is creating her to worry.  Her oxygen saturations have remained above 97% since being home.  She is eating and drinking well.   Depression screen [facility] 2/9 03/25/2019 08/07/2016 09/07/2015  Decreased Interest 0 0 0  Down, Depressed, Hopeless 0 0 0  PHQ - 2 Score 0 0 0    No Known Allergies Social History   Tobacco Use  . Smoking status: Never Smoker  . Smokeless tobacco: Never Used  Substance Use Topics  . Alcohol use: Yes    Alcohol/week: 2.0 - 3.0 standard drinks    Types: 2 - 3 Glasses of wine per week   Past Medical History:  Diagnosis Date  . Family history of ovarian cancer   . Ovarian cyst    Past Surgical History:  Procedure Laterality Date  . ADENOIDECTOMY     Family History  Problem Relation Age of Onset  . Drug abuse Mother   . COPD Mother   . Mental retardation Mother   . Early death Mother 51       blood clot  . Ovarian cancer Mother 30  dx 'late 20's', died at 91 due to COPD  . Heart disease Mother   . Breast cancer Paternal Aunt 27       is now in 6's  . Alcohol abuse Sister   . Heart disease Maternal Uncle        died late 55's  . Heart attack Maternal Grandmother 52   Allergies as of 05/29/2019   No Known Allergies     Medication List       Accurate as of May 29, 2019 11:32 AM. If you have any questions, ask your nurse or doctor.        albuterol 108 (90 Base) MCG/ACT inhaler Commonly known as: VENTOLIN HFA Inhale 1-2 puffs into the lungs every 4 (four) hours as needed for wheezing or shortness of breath.   apixaban 2.5 MG Tabs tablet Commonly known as:  ELIQUIS Take by mouth.   cholecalciferol 1000 units tablet Commonly known as: VITAMIN D Take by mouth daily. 4,000 units daily   ketoconazole 2 % shampoo Commonly known as: NIZORAL Use 5-10 ml to shampoo hair every 3 days (2x week). Leave shampoo on hair for 5 minutes, then rinse thoroughly. Use for 2-4 weeks at a time only to treat.   medroxyPROGESTERone 5 MG tablet Commonly known as: Provera Take one tablet a day for 5 days every other month if no spontaneous menses.   multivitamin tablet Take 1 tablet by mouth daily.   Omega 3 1000 MG Caps Take by mouth.   Probiotic Acidophilus Caps Take by mouth.   vitamin B-12 1000 MCG tablet Commonly known as: CYANOCOBALAMIN Take 1,000 mcg by mouth daily.   vitamin C 250 MG tablet Commonly known as: ASCORBIC ACID Take 250 mg by mouth daily.   Zinc 25 MG Tabs Take by mouth.       All past medical history, surgical history, allergies, family history, immunizations and medications were updated in the EMR today and reviewed under the history and medication portions of their EMR.      ROS: Negative, with the exception of above mentioned in HPI   Objective:  Pulse 71   Ht 5\' 8"  (1.727 m)   SpO2 97%   BMI 22.83 kg/m  Body mass index is 22.83 kg/m. Gen: Afebrile. No acute distress. Nontoxic in appearance, well developed, well nourished.  Appears well today. HENT: AT. [place].  Eyes:Pupils Equal Round Reactive to light, Extraocular movements intact,  Conjunctiva without redness, discharge or icterus. CV: No edema Chest: No cough or shortness of breath present. Skin: No rashes, purpura or petechiae.  Neuro: Alert. Oriented x3  Psych: Normal affect, dress and demeanor. Normal speech. Normal thought content and judgment.    Assessment/Plan: Mary Warren is a 54 y.o. female present for OV for Hospital discharge follow up Pneumonia due to COVID-19 virus/History of recent hospitalization Overall patient's condition is  improving. Continue to hydrate.  Continue to rest. May start to ease back into daily routine around the home over the next week.  She is aware it will take quite a few weeks to get back to full stamina. Encouraged her not to monitor her heart rate or her oxygen saturation any longer, unless she is feeling any increase in shortness of breath, fatigue or palpitations.  Reassured her that it is natural for her heart rates to decrease when sleeping.  Since she is having no symptoms with lower heart rate and her heart rate and blood pressure are at her baseline throughout the day, there  should be no concern.  She reports understanding. Her job is very demanding, and she works 60-80 hours a week.  Encouraged her to speak with her employer concerning decreased hours expected the first week back.  If she needs a note for decreased hours/20-hour work week from February 11 -17th, I would be happy to provide this for her. Patient's 21 days since testing will be February 11.  Patient's 21 days since symptoms onset is today.  She is no longer having fevers and overall improving well. Follow-up in 4 weeks with provider, chest x-ray day prior (ordered)   Reviewed expectations re: course of current medical issues.  Discussed self-management of symptoms.  Outlined signs and symptoms indicating need for more acute intervention.  Patient verbalized understanding and all questions were answered.  Patient received an After-Visit Summary.  Any changes in medications were reviewed and patient was provided with updated med list with their AVS.      No orders of the defined types were placed in this encounter.    Note is dictated utilizing voice recognition software. Although note has been proof read prior to signing, occasional typographical errors still can be missed. If any questions arise, please do not hesitate to call for verification.   electronically signed by:  Howard Pouch, DO  [place]

## 2019-06-05 ENCOUNTER — Telehealth: Payer: Self-pay

## 2019-06-05 NOTE — Telephone Encounter (Signed)
Pt was called and given information, she verbalized understanding  

## 2019-06-05 NOTE — Telephone Encounter (Signed)
Patient is on blood thinner meds. Patient reports she is having extreme leg pain and wants to take OTC meds for the pain.  Please advise.  Patient can be reached at [phone]

## 2019-06-05 NOTE — Telephone Encounter (Signed)
Use of NSAIDs are contraindicated with the use of blood thinners.  Using both together even with minimal use can cause increased bleeding.  Therefore would not recommend she take anything in the NSAID family which include (but is not a complete list) naproxen/Aleve, ibuprofen/Advil, Motrin, etc.)  Tylenol can be used safely. Felt to be pain secondary to sciatica, performing stretches daily of her lower extremities and gluteus muscles (Buttocks muscles) will help.  If needing further evaluation or intervention we need to see her in person to devise best treatment plan after evaluation.

## 2019-06-05 NOTE — Telephone Encounter (Signed)
Pt was called and said the leg pain started since being in the hospital. Has gotten worse recently. Right leg pain from lower hip to feet, dull throbbing ache. Woke her up from sleep last night. Sciatic issues in the past and feels it is from that. Pt denies swelling or redness.. She said she normally she takes Advil.

## 2019-06-24 ENCOUNTER — Encounter: Payer: Self-pay | Admitting: Family Medicine

## 2019-06-24 ENCOUNTER — Telehealth: Payer: Self-pay | Admitting: Family Medicine

## 2019-06-24 ENCOUNTER — Ambulatory Visit: Payer: BLUE CROSS/BLUE SHIELD | Admitting: Family Medicine

## 2019-06-24 ENCOUNTER — Other Ambulatory Visit: Payer: Self-pay

## 2019-06-24 VITALS — BP 128/86 | HR 77 | Temp 97.9°F | Resp 17 | Ht 68.0 in | Wt 136.4 lb

## 2019-06-24 DIAGNOSIS — J1282 Pneumonia due to coronavirus disease 2019: Secondary | ICD-10-CM

## 2019-06-24 DIAGNOSIS — U071 COVID-19: Secondary | ICD-10-CM | POA: Diagnosis not present

## 2019-06-24 DIAGNOSIS — Z8249 Family history of ischemic heart disease and other diseases of the circulatory system: Secondary | ICD-10-CM | POA: Diagnosis not present

## 2019-06-24 DIAGNOSIS — E782 Mixed hyperlipidemia: Secondary | ICD-10-CM | POA: Diagnosis not present

## 2019-06-24 DIAGNOSIS — L219 Seborrheic dermatitis, unspecified: Secondary | ICD-10-CM | POA: Diagnosis not present

## 2019-06-24 LAB — LIPID PANEL
Cholesterol: 235 mg/dL — ABNORMAL HIGH (ref 0–200)
HDL: 57.7 mg/dL (ref >39.00)
LDL Cholesterol: 155 mg/dL — ABNORMAL HIGH (ref 0–99)
NonHDL: 177.1
Total CHOL/HDL Ratio: 4
Triglycerides: 109 mg/dL (ref 0.0–149.0)
VLDL: 21.8 mg/dL (ref 0.0–40.0)

## 2019-06-24 MED ORDER — ATORVASTATIN CALCIUM 10 MG PO TABS: 10.0000 mg | ORAL_TABLET | Freq: Every day | ORAL | 3 refills | Status: DC

## 2019-06-24 NOTE — Progress Notes (Signed)
Mary Warren , 09-12-1965, 54 y.o., female MRN: [id number] Patient Care Team    Relationship Specialty Notifications Start End  Ma Hillock, DO PCP - General Family Medicine  10/26/16   Salvadore Dom, MD Consulting Physician Obstetrics and Gynecology  03/25/19     Chief Complaint  Patient presents with  . Hyperlipidemia    Fasting. Pt has not had xray yet.      Subjective:  Mary Warren  is a 54 y.o. female presents for Mixed hyperlipidemia/FH heart disease: Was eating more eggs on a diet and has since cut back on eggs. She does not have a routine exercise program. She works many hours weekly and has not had time the last year. She has a fh of heart disease and stroke.  Lipid Panel     Component Value Date/Time   CHOL 218 (H) 03/25/2019 0837   TRIG 99.0 03/25/2019 0837   HDL 46.70 03/25/2019 0837   CHOLHDL 5 03/25/2019 0837   VLDL 19.8 03/25/2019 0837   LDLCALC 152 (H) 03/25/2019 0837     Pneumonia due to COVID-19 virus Greatly improved. Back to her baseline. Repeat cxr is due.   Scalp dermatitis: Also greatly improved w/ keto shampoo.   Depression screen [facility] 2/9 03/25/2019 08/07/2016 09/07/2015  Decreased Interest 0 0 0  Down, Depressed, Hopeless 0 0 0  PHQ - 2 Score 0 0 0    No Known Allergies Social History   Tobacco Use  . Smoking status: Never Smoker  . Smokeless tobacco: Never Used  Substance Use Topics  . Alcohol use: Yes    Alcohol/week: 2.0 - 3.0 standard drinks    Types: 2 - 3 Glasses of wine per week   Past Medical History:  Diagnosis Date  . Family history of ovarian cancer   . Ovarian cyst    Past Surgical History:  Procedure Laterality Date  . ADENOIDECTOMY     Family History  Problem Relation Age of Onset  . Drug abuse Mother   . COPD Mother   . Mental retardation Mother   . Early death Mother 55       blood clot  . Ovarian cancer Mother 22       dx 'late 2022/08/01', died at 79 due to COPD  . Heart disease Mother   .  Breast cancer Paternal Aunt 71       is now in 42's  . Alcohol abuse Sister   . Heart disease Maternal Uncle        died late 90's  . Heart attack Maternal Grandmother 52   Allergies as of 06/24/2019   No Known Allergies     Medication List       Accurate as of June 24, 2019  8:29 AM. If you have any questions, ask your nurse or doctor.        STOP taking these medications   albuterol 108 (90 Base) MCG/ACT inhaler Commonly known as: VENTOLIN HFA Stopped by: Howard Pouch, DO   Eliquis 2.5 MG Tabs tablet Generic drug: apixaban Stopped by: Howard Pouch, DO     TAKE these medications   cholecalciferol 1000 units tablet Commonly known as: VITAMIN D Take by mouth daily. 4,000 units daily   ketoconazole 2 % shampoo Commonly known as: NIZORAL Use 5-10 ml to shampoo hair every 3 days (2x week). Leave shampoo on hair for 5 minutes, then rinse thoroughly. Use for 2-4 weeks at a time only to treat.  multivitamin tablet Take 1 tablet by mouth daily.   Probiotic Acidophilus Caps Take by mouth.   vitamin C 250 MG tablet Commonly known as: ASCORBIC ACID Take 250 mg by mouth daily.   Zinc 25 MG Tabs Take by mouth.       All past medical history, surgical history, allergies, family history, immunizations and medications were updated in the EMR today and reviewed under the history and medication portions of their EMR.      ROS: Negative, with the exception of above mentioned in HPI   Objective:  BP 128/86 (BP Location: Left Arm, Patient Position: Sitting, Cuff Size: Normal)   Pulse 77   Temp 97.9 F (36.6 C) (Temporal)   Resp 17   Ht 5\' 8"  (1.727 m)   Wt 136 lb 6 oz (61.9 kg)   LMP 02/24/2019 (Approximate)   SpO2 97%   BMI 20.74 kg/m  Body mass index is 20.74 kg/m. Gen: Afebrile. No acute distress.  HENT: AT. [place].  Eyes:Pupils Equal Round Reactive to light, Extraocular movements intact,  Conjunctiva without redness, discharge or icterus. Neck/lymp/endocrine:  Supple,no lymphadenopathy, no thyromegaly CV: RRR no murmur, no edema Chest: CTAB, no wheeze or crackles Skin: no rashes, purpura or petechiae.  Neuro:  Normal gait. PERLA. EOMi. Alert. Oriented. Psych: Normal affect, dress and demeanor. Normal speech. Normal thought content and judgment.  Assessment/Plan: Mary Warren is a 54 y.o. female present for OV for Pneumonia due to COVID-19 virus/History of recent hospitalization - greatly improved. Symptoms resolved.  - cxr ordered last appt. Pt encouraged to have completed either tomorrow or Friday. She will be called with results.  Hyperlipidemia: - lipid recheck today. If still elevated would advise to start statin. Pt agreeable.   Scalp dermatitis: - improved w/ ketoconazole shampoo. She did notice when taking steroids (in the hosp for covid) her scalp was completely clear.   Reviewed expectations re: course of current medical issues.  Discussed self-management of symptoms.  Outlined signs and symptoms indicating need for more acute intervention.  Patient verbalized understanding and all questions were answered.  Patient received an After-Visit Summary.  Any changes in medications were reviewed and patient was provided with updated med list with their AVS.      Orders Placed This Encounter  Procedures  . Lipid panel   No orders of the defined types were placed in this encounter.  Referral Orders  No referral(s) requested today     Note is dictated utilizing voice recognition software. Although note has been proof read prior to signing, occasional typographical errors still can be missed. If any questions arise, please do not hesitate to call for verification.   electronically signed by:  Howard Pouch, DO  [place]

## 2019-06-24 NOTE — Patient Instructions (Signed)

## 2019-06-24 NOTE — Telephone Encounter (Signed)
Please inform patient her cholesterol panel is about the same as prior.   Her LDL is 155, was 152, Her HDL has improved is 57, was 46. Triglycerides are about the same at 109, was 99.  Therefore I do suggest a low dose statin which we discussed today.  I have called this in for her. I would recommend a mediterranean diet and regular exercise.  A mediterranean diet is high in fruits, vegetables, whole grains, fish, chicken, nuts, healthy fats (olive oil or canola oil). Low fat dairy. There are many online resources and books on this diet. Limit butter, margarine, red meat and sweets.   Follow-up in 3 months with provider for her hyperlipidemia, with repeat fasting cholesterol panel and recheck her liver enzymes after initiating a statin.

## 2019-06-25 NOTE — Telephone Encounter (Signed)
Pt was called and message was left on  VM letting pt know my chart message was sent with lab results/instructions

## 2019-06-27 ENCOUNTER — Ambulatory Visit: Payer: BLUE CROSS/BLUE SHIELD | Admitting: Family Medicine

## 2019-12-04 ENCOUNTER — Telehealth: Payer: Self-pay

## 2019-12-04 NOTE — Telephone Encounter (Signed)
OV/PUS 01/2019- right ovarian cyst H/o endometrioma Mildly elevated CA -125 Perimenopausal, no HRT  Spoke with pt. Pt states since having PUS last year in Oct, pt states was feeling good, until 1 week ago. Pt states experiencing mild pain that is crampy  on right side with ovarian cyst, especially when lying on that side in bed and started having some vaginal spotting as well. Pt states only noticing vaginal spotting that is light brown when wiping. Not wearing any pad or panty liner. Pt denies any heavy bleeding, severe abd pain or NVD, fever, chills.  Reviewed notes from [facility] in Oct 2020, pt asked about taking cyclic Provera, pt states did not start taking.  Pt advised to have follow up OV with Dr Talbert Nan for sx. Pt offered multiple dates, pt declines due to going out of town next week on vacation. Pt scheduled for OV on 8/23 at 930 am. Pt agreeable and verbalized understanding to date and time of appt. Pt advised ER precautions while on vacation. Pt agreeable.   Encounter closed.   OV notes:  Depending on results will make a plan for f/u CA 125 and ultrasound in 6 or 12 months Cyclic provera sent to help with irregular cycles.

## 2019-12-04 NOTE — Telephone Encounter (Signed)
Patient states she has a cyst in right ovary. Patient states that she is experiencing pain in that area and having "blood or discharge."

## 2019-12-15 ENCOUNTER — Telehealth: Payer: Self-pay

## 2019-12-15 ENCOUNTER — Ambulatory Visit: Payer: Self-pay | Admitting: Obstetrics and Gynecology
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
## 2019-12-15 NOTE — Telephone Encounter (Signed)
Spoke with patient. Patient was scheduled for OV for f/u of right ovarian cyst and evaluation of vaginal bleeding and d/c.   Patient reports symptoms have resolved, denies pelvic pain, bleeding or vag d/c.  OV r/s to 8/25 at 4pm with Dr. Talbert Nan.   Patient verbalizes understanding and is agreeable.   Encounter closed.

## 2019-12-16 NOTE — Progress Notes (Deleted)
GYNECOLOGY  VISIT   HPI: 54 y.o.   Married White or Caucasian Not Hispanic or Latino  female   [id number] with No LMP recorded. (Menstrual status: Irregular Periods).   here for a follow up for an ovarian cyst.     GYNECOLOGIC HISTORY: No LMP recorded. (Menstrual status: Irregular Periods). Contraception:none  Menopausal hormone therapy: none         OB History    Gravida  1   Para      Term      Preterm      AB  1   Living        SAB      TAB      Ectopic      Multiple      Live Births                 Patient Active Problem List   Diagnosis Date Noted  . Mixed hyperlipidemia 06/24/2019  . FH: heart disease 06/24/2019  . Pneumonia due to COVID-19 virus 05/21/2019  . Seborrheic dermatitis of scalp 03/25/2019  . Genetic testing 11/08/2016  . Family history of ovarian cancer   . 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 High risk of ovarian cancer 08/07/2016  . Encounter for preventive health examination 09/07/2015  . Skin lesion of breast 09/07/2015    Past Medical History:  Diagnosis Date  . Family history of ovarian cancer   . Ovarian cyst     Past Surgical History:  Procedure Laterality Date  . ADENOIDECTOMY      Current Outpatient Medications  Medication Sig Dispense Refill  . atorvastatin (LIPITOR) 10 MG tablet Take 1 tablet (10 mg total) by mouth daily. 90 tablet 3  . cholecalciferol (VITAMIN D) 1000 units tablet Take by mouth daily. 4,000 units daily    . ketoconazole (NIZORAL) 2 % shampoo Use 5-10 ml to shampoo hair every 3 days (2x week). Leave shampoo on hair for 5 minutes, then rinse thoroughly. Use for 2-4 weeks at a time only to treat. 120 mL 5  . Lactobacillus (PROBIOTIC ACIDOPHILUS) CAPS Take by mouth.    . Multiple Vitamin (MULTIVITAMIN) tablet Take 1 tablet by mouth daily.    . vitamin C (ASCORBIC ACID) 250 MG tablet Take 250 mg by mouth daily.    . Zinc 25 MG TABS Take by mouth.     No current facility-administered medications for this  visit.     ALLERGIES: Patient has no known allergies.  Family History  Problem Relation Age of Onset  . Drug abuse Mother   . COPD Mother   . Mental retardation Mother   . Early death Mother 8       blood clot  . Ovarian cancer Mother 13       dx 'late August 02, 2022', died at 82 due to COPD  . Heart disease Mother   . Breast cancer Paternal Aunt 66       is now in 55's  . Alcohol abuse Sister   . Heart disease Maternal Uncle        died late 72's  . Heart attack Maternal Grandmother 52    Social History   Socioeconomic History  . Marital status: Married    Spouse name: Not on file  . Number of children: Not on file  . Years of education: Not on file  . Highest education level: Not on file  Occupational History  . Not on file  Tobacco Use  . Smoking  status: Never Smoker  . Smokeless tobacco: Never Used  Vaping Use  . Vaping Use: Never used  Substance and Sexual Activity  . Alcohol use: Yes    Alcohol/week: 2.0 - 3.0 standard drinks    Types: 2 - 3 Glasses of wine per week  . Drug use: No  . Sexual activity: Yes    Partners: Male    Birth control/protection: Other-see comments    Comment: Husband had a vasectomy  Other Topics Concern  . 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   Exercises but nor routinely.    Smoke detector in the home.   Wears her set belt.   Feels safe in her relationships.    Social Determinants of Health   Financial Resource Strain:   . Difficulty of Paying Living Expenses: Not on file  Food Insecurity:   . Worried About Charity fundraiser in the Last Year: Not on file  . Ran Out of Food in the Last Year: Not on file  Transportation Needs:   . Lack of Transportation (Medical): Not on file  . Lack of Transportation (Non-Medical): Not on file  Physical Activity:   . Days of Exercise per Week: Not on file  . Minutes of Exercise per Session: Not on file  Stress:   . Feeling  of Stress : Not on file  Social Connections:   . Frequency of Communication with Friends and Family: Not on file  . Frequency of Social Gatherings with Friends and Family: Not on file  . Attends Religious Services: Not on file  . Active Member of Clubs or Organizations: Not on file  . Attends Archivist Meetings: Not on file  . Marital Status: Not on file  Intimate Partner Violence:   . Fear of Current or Ex-Partner: Not on file  . Emotionally Abused: Not on file  . Physically Abused: Not on file  . Sexually Abused: Not on file    ROS  PHYSICAL EXAMINATION:    There were no vitals taken for this visit.    General appearance: alert, cooperative and appears stated age Neck: no adenopathy, supple, symmetrical, trachea midline and thyroid {CHL AMB PHY EX THYROID NORM [phone]::"normal to inspection and palpation"} Breasts: {Exam; breast:13139::"normal appearance, no masses or tenderness"} Abdomen: soft, non-tender; non distended, no masses,  no organomegaly  Pelvic: External genitalia:  no lesions              Urethra:  normal appearing urethra with no masses, tenderness or lesions              Bartholins and Skenes: normal                 Vagina: normal appearing vagina with normal color and discharge, no lesions              Cervix: {CHL AMB PHY EX CERVIX NORM DEFAULT:[phone]::"no lesions"}              Bimanual Exam:  Uterus:  {CHL AMB PHY EX UTERUS NORM [phone]::"normal size, contour, position, consistency, mobility, non-tender"}              Adnexa: {CHL AMB PHY EX ADNEXA NO MASS [phone]::"no mass, fullness, tenderness"}              Rectovaginal: {yes no:[id number]}.  Confirms.              Anus:  normal  sphincter tone, no lesions  Chaperone was present for exam.  ASSESSMENT     PLAN    An After Visit Summary was printed and given to the patient.  *** minutes face to face time of which over 50% was spent in counseling.

## 2019-12-17 ENCOUNTER — Other Ambulatory Visit: Payer: Self-pay | Admitting: Radiology

## 2019-12-17 ENCOUNTER — Ambulatory Visit: Payer: Self-pay | Admitting: Obstetrics and Gynecology

## 2019-12-17 ENCOUNTER — Other Ambulatory Visit: Payer: Self-pay

## 2019-12-17 ENCOUNTER — Other Ambulatory Visit: Payer: BLUE CROSS/BLUE SHIELD

## 2019-12-17 DIAGNOSIS — Z20822 Contact with and (suspected) exposure to covid-19: Secondary | ICD-10-CM

## 2019-12-18 LAB — NOVEL CORONAVIRUS, NAA: SARS-CoV-2, NAA: NOT DETECTED

## 2019-12-18 LAB — SARS-COV-2, NAA 2 DAY TAT

## 2019-12-30 ENCOUNTER — Other Ambulatory Visit: Payer: Self-pay

## 2019-12-30 ENCOUNTER — Encounter: Payer: Self-pay | Admitting: Obstetrics and Gynecology

## 2019-12-30 ENCOUNTER — Other Ambulatory Visit (HOSPITAL_COMMUNITY)
Admission: RE | Admit: 2019-12-30 | Discharge: 2019-12-30 | Disposition: A | Discharge status: Home / Self Care | Payer: BLUE CROSS/BLUE SHIELD | Source: Ambulatory Visit | Attending: Obstetrics and Gynecology | Admitting: Obstetrics and Gynecology

## 2019-12-30 ENCOUNTER — Ambulatory Visit: Payer: BLUE CROSS/BLUE SHIELD | Admitting: Obstetrics and Gynecology

## 2019-12-30 VITALS — BP 110/62 | HR 70 | Ht 66.0 in | Wt 134.0 lb

## 2019-12-30 DIAGNOSIS — Z8742 Personal history of other diseases of the female genital tract: Secondary | ICD-10-CM | POA: Diagnosis not present

## 2019-12-30 DIAGNOSIS — Z124 Encounter for screening for malignant neoplasm of cervix: Secondary | ICD-10-CM

## 2019-12-30 DIAGNOSIS — N939 Abnormal uterine and vaginal bleeding, unspecified: Secondary | ICD-10-CM | POA: Diagnosis not present

## 2019-12-30 DIAGNOSIS — N951 Menopausal and female climacteric states: Secondary | ICD-10-CM

## 2019-12-30 NOTE — Patient Instructions (Signed)

## 2019-12-30 NOTE — Progress Notes (Signed)
GYNECOLOGY  VISIT   HPI: 54 y.o.   Married White or Caucasian Not Hispanic or Latino  female   G1P0010 with No LMP recorded. (Menstrual status: Irregular Periods).   here for abnormal bleeding.  She states that mid august she had a brown discharge. She has not had any since that week.    She has been followed for a small complex ovarian cyst since 5/18. The cyst was detected on screening secondary to a FH of her mother having ovarian cancer. Her CA 125 has also been mildly elevated in the past. At her last visit in 53/21 her cyst was slightly smaller and her CA 125 was normal.  The patient was seen by GYN Onc and declined surgery. F/U was felt to be reasonable.   At the time of her visit in 53/21 she c/o irregular menses. She had a cycle in 5/20 then no bleeding until 10/20 when she bleed for 7-9 days. She was given a script for cyclic provera, but didn't take it.  No bleeding since 10/21. She reports spotting for 2-3 days in August. She had a sharp intermittent pain during the time that she was spotting. The pain and spotting both resolved at the same time.   Last pap in 5/17 was negative with negative HPV.  She has night sweats and hot flashes, only happen at night. Overall tolerable.   GYNECOLOGIC HISTORY: No LMP recorded. (Menstrual status: Irregular Periods). Contraception:none  Menopausal hormone therapy: none         OB History    Gravida  1   Para      Term      Preterm      AB  1   Living        SAB      TAB      Ectopic      Multiple      Live Births                 Patient Active Problem List   Diagnosis Date Noted  . Mixed hyperlipidemia 06/24/2019  . FH: heart disease 06/24/2019  . Pneumonia due to COVID-19 virus 05/21/2019  . Seborrheic dermatitis of scalp 03/25/2019  . Genetic testing 11/08/2016  . Family history of ovarian cancer   . 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 High risk of ovarian cancer 08/07/2016  . Encounter for preventive  health examination 09/07/2015  . Skin lesion of breast 09/07/2015    Past Medical History:  Diagnosis Date  . Family history of ovarian cancer   . Ovarian cyst     Past Surgical History:  Procedure Laterality Date  . ADENOIDECTOMY      Current Outpatient Medications  Medication Sig Dispense Refill  . atorvastatin (LIPITOR) 10 MG tablet Take 1 tablet (10 mg total) by mouth daily. 90 tablet 3  . cholecalciferol (VITAMIN D) 1000 units tablet Take by mouth daily. 4,000 units daily    . ketoconazole (NIZORAL) 2 % shampoo Use 5-10 ml to shampoo hair every 3 days (2x week). Leave shampoo on hair for 5 minutes, then rinse thoroughly. Use for 2-4 weeks at a time only to treat. 120 mL 5  . Lactobacillus (PROBIOTIC ACIDOPHILUS) CAPS Take by mouth.    . Multiple Vitamin (MULTIVITAMIN) tablet Take 1 tablet by mouth daily.    . vitamin C (ASCORBIC ACID) 250 MG tablet Take 250 mg by mouth daily.    . Zinc 25 MG TABS Take by mouth.  No current facility-administered medications for this visit.     ALLERGIES: Patient has no known allergies.  Family History  Problem Relation Age of Onset  . Drug abuse Mother   . COPD Mother   . Mental retardation Mother   . Early death Mother 70       blood clot  . Ovarian cancer Mother 83       dx 'late August 04, 2022', died at 11 due to COPD  . Heart disease Mother   . Breast cancer Paternal Aunt 28       is now in 32's  . Alcohol abuse Sister   . Heart disease Maternal Uncle        died late 24's  . Heart attack Maternal Grandmother 52    Social History   Socioeconomic History  . Marital status: Married    Spouse name: Not on file  . Number of children: Not on file  . Years of education: Not on file  . Highest education level: Not on file  Occupational History  . Not on file  Tobacco Use  . Smoking status: Never Smoker  . Smokeless tobacco: Never Used  Vaping Use  . Vaping Use: Never used  Substance and Sexual Activity  . Alcohol use: Yes     Alcohol/week: 2.0 - 3.0 standard drinks    Types: 2 - 3 Glasses of wine per week  . Drug use: No  . Sexual activity: Yes    Partners: Male    Birth control/protection: Other-see comments    Comment: Husband had a vasectomy  Other Topics Concern  . 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   Exercises but nor routinely.    Smoke detector in the home.   Wears her set belt.   Feels safe in her relationships.    Social Determinants of Health   Financial Resource Strain:   . Difficulty of Paying Living Expenses: Not on file  Food Insecurity:   . Worried About Charity fundraiser in the Last Year: Not on file  . Ran Out of Food in the Last Year: Not on file  Transportation Needs:   . Lack of Transportation (Medical): Not on file  . Lack of Transportation (Non-Medical): Not on file  Physical Activity:   . Days of Exercise per Week: Not on file  . Minutes of Exercise per Session: Not on file  Stress:   . Feeling of Stress : Not on file  Social Connections:   . Frequency of Communication with Friends and Family: Not on file  . Frequency of Social Gatherings with Friends and Family: Not on file  . Attends Religious Services: Not on file  . Active Member of Clubs or Organizations: Not on file  . Attends Archivist Meetings: Not on file  . Marital Status: Not on file  Intimate Partner Violence:   . Fear of Current or Ex-Partner: Not on file  . Emotionally Abused: Not on file  . Physically Abused: Not on file  . Sexually Abused: Not on file    Review of Systems  Musculoskeletal: Positive for back pain.  All other systems reviewed and are negative.   PHYSICAL EXAMINATION:    There were no vitals taken for this visit.    General appearance: alert, cooperative and appears stated age  Pelvic: External genitalia:  no lesions  Urethra:  normal appearing urethra with no masses, tenderness or  lesions              Bartholins and Skenes: normal                 Vagina: normal appearing vagina with normal color and discharge, no lesions              Cervix: no lesions and not friable              Bimanual Exam:  Uterus:  normal size, contour, position, consistency, mobility, non-tender and retroverted              Adnexa: slight fullness on the right (?stool), not tender               Chaperone was present for exam.  ASSESSMENT Perimenopausal  AUB H/O complex right ovarian cyst    PLAN Pap with hpv Return for pelvic ultrasound Further plans depending on results She declines cyclic provera

## 2019-12-31 ENCOUNTER — Telehealth: Payer: Self-pay | Admitting: Obstetrics and Gynecology

## 2019-12-31 NOTE — Telephone Encounter (Signed)
Spoke with patient regarding benefits for recommended ultrasound. Patient acknowledges understanding of information presented, but requests to call back at a more convenient time to discuss benefits further. Patient scheduled appointment for 01/06/2020 at 0200PM with Sumner Boast, MD. Encounter closed.

## 2020-01-02 LAB — CYTOLOGY - PAP
Comment: NEGATIVE
Diagnosis: NEGATIVE
High risk HPV: NEGATIVE

## 2020-01-06 ENCOUNTER — Other Ambulatory Visit: Payer: BLUE CROSS/BLUE SHIELD | Admitting: Obstetrics and Gynecology

## 2020-01-06 ENCOUNTER — Telehealth: Payer: Self-pay

## 2020-01-06 ENCOUNTER — Other Ambulatory Visit: Payer: BLUE CROSS/BLUE SHIELD

## 2020-01-06 ENCOUNTER — Encounter: Payer: Self-pay | Admitting: Obstetrics and Gynecology

## 2020-01-06 NOTE — Telephone Encounter (Signed)
Patient did not keep appointment for [place] today (01/06/20).

## 2020-01-09 ENCOUNTER — Encounter: Payer: Self-pay | Admitting: Family Medicine

## 2020-01-09 NOTE — Telephone Encounter (Signed)
Pt scheduled for 01/23/20

## 2020-01-09 NOTE — Telephone Encounter (Signed)
Please call patient and schedule her for follow-up on her scalp concerns.  Patient last seen 6 months ago for this condition.  If she is having changes in her condition and needing alterations in her management, we would need to see her to evaluate and discuss options.

## 2020-01-13 ENCOUNTER — Ambulatory Visit (INDEPENDENT_AMBULATORY_CARE_PROVIDER_SITE_OTHER): Payer: BLUE CROSS/BLUE SHIELD

## 2020-01-13 ENCOUNTER — Other Ambulatory Visit: Payer: Self-pay

## 2020-01-13 ENCOUNTER — Encounter: Payer: Self-pay | Admitting: Obstetrics and Gynecology

## 2020-01-13 ENCOUNTER — Ambulatory Visit (INDEPENDENT_AMBULATORY_CARE_PROVIDER_SITE_OTHER): Payer: BLUE CROSS/BLUE SHIELD | Admitting: Obstetrics and Gynecology

## 2020-01-13 VITALS — BP 122/68 | HR 73 | Ht 66.0 in | Wt 134.6 lb

## 2020-01-13 DIAGNOSIS — N951 Menopausal and female climacteric states: Secondary | ICD-10-CM

## 2020-01-13 DIAGNOSIS — Z8742 Personal history of other diseases of the female genital tract: Secondary | ICD-10-CM | POA: Diagnosis not present

## 2020-01-13 DIAGNOSIS — Z8041 Family history of malignant neoplasm of ovary: Secondary | ICD-10-CM

## 2020-01-13 DIAGNOSIS — N939 Abnormal uterine and vaginal bleeding, unspecified: Secondary | ICD-10-CM

## 2020-01-13 NOTE — Progress Notes (Signed)
GYNECOLOGY  VISIT   HPI: 54 y.o.   Married White or Caucasian Not Hispanic or Latino  female   G1P0010 with Patient's last menstrual period was 03/22/2019 (exact date).   here for f/u of a small complex ovarian cyst noted first in 5/18, done for a FH of ovarian cancer. CA 125 has been mildly elevated and then normal. She declined surgery.  She is perimenopausal, cycle in 10/20 then no bleeding again until August when she spotted x 2-3 days.    GYNECOLOGIC HISTORY: Patient's last menstrual period was 03/22/2019 (exact date). Contraception:perimenopausal Menopausal hormone therapy: none        OB History    Gravida  1   Para      Term      Preterm      AB  1   Living        SAB      TAB      Ectopic      Multiple      Live Births                 Patient Active Problem List   Diagnosis Date Noted  . Mixed hyperlipidemia 06/24/2019  . FH: heart disease 06/24/2019  . Pneumonia due to COVID-19 virus 05/21/2019  . Seborrheic dermatitis of scalp 03/25/2019  . Genetic testing 11/08/2016  . Family history of ovarian cancer   . 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 High risk of ovarian cancer 08/07/2016  . Encounter for preventive health examination 09/07/2015  . Skin lesion of breast 09/07/2015    Past Medical History:  Diagnosis Date  . Family history of ovarian cancer   . Ovarian cyst     Past Surgical History:  Procedure Laterality Date  . ADENOIDECTOMY      Current Outpatient Medications  Medication Sig Dispense Refill  . cholecalciferol (VITAMIN D) 1000 units tablet Take by mouth daily. 4,000 units daily    . ketoconazole (NIZORAL) 2 % shampoo Use 5-10 ml to shampoo hair every 3 days (2x week). Leave shampoo on hair for 5 minutes, then rinse thoroughly. Use for 2-4 weeks at a time only to treat. 120 mL 5  . Lactobacillus (PROBIOTIC ACIDOPHILUS) CAPS Take by mouth.    . Multiple Vitamin (MULTIVITAMIN) tablet Take 1 tablet by mouth daily.    . vitamin  C (ASCORBIC ACID) 250 MG tablet Take 250 mg by mouth daily.    . Zinc 25 MG TABS Take by mouth.     No current facility-administered medications for this visit.     ALLERGIES: Patient has no known allergies.  Family History  Problem Relation Age of Onset  . Drug abuse Mother   . COPD Mother   . Mental retardation Mother   . Early death Mother 19       blood clot  . Ovarian cancer Mother 28       dx 'late 14-Aug-2022', died at 42 due to COPD  . Heart disease Mother   . Breast cancer Paternal Aunt 68       is now in 45's  . Alcohol abuse Sister   . Heart disease Maternal Uncle        died late 56's  . Heart attack Maternal Grandmother 52    Social History   Socioeconomic History  . Marital status: Married    Spouse name: Not on file  . Number of children: Not on file  . Years of education: Not on file  .  Highest education level: Not on file  Occupational History  . Not on file  Tobacco Use  . Smoking status: Never Smoker  . Smokeless tobacco: Never Used  Vaping Use  . Vaping Use: Never used  Substance and Sexual Activity  . Alcohol use: Yes    Alcohol/week: 2.0 - 3.0 standard drinks    Types: 2 - 3 Glasses of wine per week  . Drug use: No  . Sexual activity: Yes    Partners: Male    Birth control/protection: Other-see comments    Comment: Husband had a vasectomy  Other Topics Concern  . 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   Exercises but nor routinely.    Smoke detector in the home.   Wears her set belt.   Feels safe in her relationships.    Social Determinants of Health   Financial Resource Strain:   . Difficulty of Paying Living Expenses: Not on file  Food Insecurity:   . Worried About Charity fundraiser in the Last Year: Not on file  . Ran Out of Food in the Last Year: Not on file  Transportation Needs:   . Lack of Transportation (Medical): Not on file  . Lack of Transportation  (Non-Medical): Not on file  Physical Activity:   . Days of Exercise per Week: Not on file  . Minutes of Exercise per Session: Not on file  Stress:   . Feeling of Stress : Not on file  Social Connections:   . Frequency of Communication with Friends and Family: Not on file  . Frequency of Social Gatherings with Friends and Family: Not on file  . Attends Religious Services: Not on file  . Active Member of Clubs or Organizations: Not on file  . Attends Archivist Meetings: Not on file  . Marital Status: Not on file  Intimate Partner Violence:   . Fear of Current or Ex-Partner: Not on file  . Emotionally Abused: Not on file  . Physically Abused: Not on file  . Sexually Abused: Not on file    ROS  PHYSICAL EXAMINATION:    LMP 03/22/2019 (Exact Date)     General appearance: alert, cooperative and appears stated age  Ultrasound images reviewed with the patient. Thin endometrium, stable right ovarian cyst.  ASSESSMENT Perimenopausal bleeding, thin endometrial stripe FH of ovarian cancer She is being followed for a small complex right ovarian cyst which is stable   PLAN CA 125 now Plan f/u ultrasound in one year Call with recurrent bleeding Normal pap earlier this month

## 2020-01-14 ENCOUNTER — Other Ambulatory Visit: Payer: Self-pay | Admitting: Obstetrics and Gynecology

## 2020-01-14 DIAGNOSIS — Z8041 Family history of malignant neoplasm of ovary: Secondary | ICD-10-CM

## 2020-01-14 DIAGNOSIS — Z8742 Personal history of other diseases of the female genital tract: Secondary | ICD-10-CM

## 2020-01-14 LAB — CA 125: Cancer Antigen (CA) 125: 15.9 U/mL (ref 0.0–38.1)

## 2020-01-23 ENCOUNTER — Telehealth: Payer: Self-pay | Admitting: Family Medicine

## 2020-01-23 ENCOUNTER — Other Ambulatory Visit: Payer: Self-pay

## 2020-01-23 ENCOUNTER — Encounter: Payer: Self-pay | Admitting: Family Medicine

## 2020-01-23 ENCOUNTER — Ambulatory Visit (INDEPENDENT_AMBULATORY_CARE_PROVIDER_SITE_OTHER): Payer: BLUE CROSS/BLUE SHIELD | Admitting: Family Medicine

## 2020-01-23 VITALS — BP 139/96 | HR 62 | Temp 98.4°F | Ht 66.0 in | Wt 136.0 lb

## 2020-01-23 DIAGNOSIS — Z1211 Encounter for screening for malignant neoplasm of colon: Secondary | ICD-10-CM | POA: Diagnosis not present

## 2020-01-23 DIAGNOSIS — L219 Seborrheic dermatitis, unspecified: Secondary | ICD-10-CM

## 2020-01-23 MED ORDER — TRIAMCINOLONE ACETONIDE 0.147 MG/GM EX AERS: INHALATION_SPRAY | Freq: Two times a day (BID) | CUTANEOUS | 5 refills | Status: DC

## 2020-01-23 MED ORDER — CICLOPIROX-SALICYLIC ACID 0.77-2 % EX SHAM: MEDICATED_SHAMPOO | CUTANEOUS | 5 refills | Status: DC

## 2020-01-23 NOTE — Progress Notes (Signed)
This visit occurred during the SARS-CoV-2 public health emergency.  Safety protocols were in place, including screening questions prior to the visit, additional usage of staff PPE, and extensive cleaning of exam room while observing appropriate contact time as indicated for disinfecting solutions.    Mary Warren , 25-Jun-1965, 54 y.o., female MRN: [id number] Patient Care Team    Relationship Specialty Notifications Start End  Ma Hillock, DO PCP - General Family Medicine  10/26/16   Salvadore Dom, MD Consulting Physician Obstetrics and Gynecology  03/25/19     Chief Complaint  Patient presents with  . Hair/Scalp Problem    f/u no resulotion with shampoo after a year use     Subjective: Pt presents for an OV with complaints of recurrent and uncontrolled scalp dermatitis.  Patient has been using the ketoconazole as prescribed for flares intermittently over the last year without great resolution.  She feels her flakiness has increased over this time and has become more embarrassing.  She reports the majority of her flakiness comes from the left side of her head.  She does not have a history of prior eczema or psoriasis.  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   Exercises but nor routinely.    Smoke detector in the home.   Wears her set belt.   Feels safe in her relationships.    Past Medical History:  Diagnosis Date  . Family history of ovarian cancer   . Ovarian cyst    Past Surgical History:  Procedure Laterality Date  . ADENOIDECTOMY     Family History  Problem Relation Age of Onset  . Drug abuse Mother   . COPD Mother   . Mental retardation Mother   . Early death Mother 62       blood clot  . Ovarian cancer Mother 43        dx 'late July 18, 2022', died at 17 due to COPD  . Heart disease Mother   . Breast cancer Paternal Aunt 22       is now in 66's  . Alcohol abuse Sister   . Heart disease Maternal Uncle        died late 72's  . Heart attack Maternal Grandmother 52   Allergies as of 01/23/2020   No Known Allergies     Medication List       Accurate as of January 23, 2020 11:59 PM. If you have any questions, ask your nurse or doctor.        ADRENAL PO Take by mouth.   cholecalciferol 1000 units tablet Commonly known as: VITAMIN D Take by mouth daily. 4,000 units daily   Ciclopirox-Salicylic Acid 9.83-3 % Sham Shampoo hair 3-4 days  for 4 weeks, then as needed for flares. Started by: Howard Pouch, DO   ketoconazole 2 % shampoo Commonly known as: NIZORAL Use 5-10 ml to shampoo hair every 3 days (2x week). Leave shampoo on hair for 5 minutes, then rinse thoroughly. Use for 2-4 weeks at a time only to treat.   multivitamin tablet Take 1 tablet by mouth daily.   Probiotic Acidophilus Caps Take by mouth.   triamcinolone 0.147 MG/GM topical spray Commonly known as: KENALOG Apply topically  2 (two) times daily. For 4 weeks, then prn Started by: Howard Pouch, DO   vitamin C 250 MG tablet Commonly known as: ASCORBIC ACID Take 250 mg by mouth daily.   Zinc 25 MG Tabs Take by mouth.       All past medical history, surgical history, allergies, family history, immunizations andmedications were updated in the EMR today and reviewed under the history and medication portions of their EMR.     ROS: Negative, with the exception of above mentioned in HPI   Objective:  BP (!) 139/96   Pulse 62   Temp 98.4 F (36.9 C) (Oral)   Ht 5\' 6"  (1.676 m)   Wt 136 lb (61.7 kg)   LMP 03/22/2019 (Exact Date)   SpO2 99%   BMI 21.95 kg/m  Body mass index is 21.95 kg/m. Gen: Afebrile. No acute distress. Nontoxic in appearance, well developed, well nourished.  HENT: AT. [place].  Large thick scaly plaque-like  lesions along posterior auricular hairline to posterior neck. Eyes:Pupils Equal Round Reactive to light, Extraocular movements intact,  Conjunctiva without redness, discharge or icterus. Skin: no rashes, purpura or petechiae.  Neuro: Alert. Oriented x3  Psych: Normal affect, dress and demeanor. Normal speech. Normal thought content and judgment.  No exam data present No results found. No results found for this or any previous visit (from the past 24 hour(s)).  Assessment/Plan: Mary Warren is a 54 y.o. female present for OV for  Seborrheic dermatitis of scalp: Plaque-like lesions mostly located on left side of scalp along hairline.  Have progressed to thickened plaque like lesions that appear more consistent with psoriasis of the scalp, however patient has had no prior history of psoriasis. Discussed options with her today and she is agreeable to dermatology referral. In the meantime, will change regimen in hopes to get her condition better controlled prior to dermatology referral. Discontinue ketoconazole altogether Start ciclopirox-salicylic acid shampoo 3 times a week for 4 weeks then as needed for flares. Start Kenalog topical spray over areas of plaque-like lesions 2 times a day for 4 weeks, then as needed.  Colon cancer screening: Patient would like a reorder of Cologuard.  She had not completed it at the first ordering.  Reviewed expectations re: course of current medical issues.  Discussed self-management of symptoms.  Outlined signs and symptoms indicating need for more acute intervention.  Patient verbalized understanding and all questions were answered.  Patient received an After-Visit Summary.    Orders Placed This Encounter  Procedures  . Cologuard  . Ambulatory referral to Dermatology   Meds ordered this encounter  Medications  . triamcinolone (KENALOG) 0.147 MG/GM topical spray    Sig: Apply topically 2 (two) times daily. For 4 weeks, then prn    Dispense:   63 g    Refill:  5  . Ciclopirox-Salicylic Acid 3.90-3 % SHAM    Sig: Shampoo hair 3-4 days  for 4 weeks, then as needed for flares.    Dispense:  120 g    Refill:  5    Referral Orders     Ambulatory referral to Dermatology   Note is dictated utilizing voice recognition software. Although note has been proof read prior to signing, occasional typographical errors still can be missed. If any questions arise, please do not hesitate to call for verification.   electronically signed by:  Howard Pouch, DO  [place]

## 2020-01-23 NOTE — Telephone Encounter (Signed)
LVM for pt in regards to medication being sent and to pick up whichever is consider more affordable.

## 2020-01-23 NOTE — Telephone Encounter (Signed)
Please inform pt I called in a shampoo that has the antifungal and the salicylic acid combo. She does not have to purchase the additional salicylic acid shampoo if this once affordable to her.   Use per label instructions.

## 2020-01-23 NOTE — Patient Instructions (Addendum)
Selsun blue or like product daily.  Try the T/SAL from [employer] to help remove the heavy scales.  I am trying to call in a different solution clobetasol solution. > which is an antifungal.  And betamethasone or like product> which is a steroid.   Seborrheic Dermatitis, Adult Seborrheic dermatitis is a skin disease that causes red, scaly patches. It usually occurs on the scalp, and it is often called dandruff. The patches may appear on other parts of the body. Skin patches tend to appear where there are many oil glands in the skin. Areas of the body that are commonly affected include:  Scalp.  Skin folds of the body.  Ears.  Eyebrows.  Neck.  Face.  Armpits.  The bearded area of men's faces. The condition may come and go for no known reason, and it is often long-lasting (chronic). What are the causes? The cause of this condition is not known. What increases the risk? This condition is more likely to develop in people who:  Have certain conditions, such as: ? HIV (human immunodeficiency virus). ? AIDS (acquired immunodeficiency syndrome). ? Parkinson disease. ? Mood disorders, such as depression.  Are 25-55 years old. What are the signs or symptoms? Symptoms of this condition include:  Thick scales on the scalp.  Redness on the face or in the armpits.  Skin that is flaky. The flakes may be white or yellow.  Skin that seems oily or dry but is not helped with moisturizers.  Itching or burning in the affected areas. How is this diagnosed? This condition is diagnosed with a medical history and physical exam. A sample of your skin may be tested (skin biopsy). You may need to see a skin specialist (dermatologist). How is this treated? There is no cure for this condition, but treatment can help to manage the symptoms. You may get treatment to remove scales, lower the risk of skin infection, and reduce swelling or itching. Treatment may include:  Creams that reduce  swelling and irritation (steroids).  Creams that reduce skin yeast.  Medicated shampoo, soaps, moisturizing creams, or ointments.  Medicated moisturizing creams or ointments. Follow these instructions at home:  Apply over-the-counter and prescription medicines only as told by your health care provider.  Use any medicated shampoo, soaps, skin creams, or ointments only as told by your health care provider.  Keep all follow-up visits as told by your health care provider. This is important. Contact a health care provider if:  Your symptoms do not improve with treatment.  Your symptoms get worse.  You have new symptoms. This information is not intended to replace advice given to you by your health care provider. Make sure you discuss any questions you have with your health care provider. Document Revised: 03/23/2017 Document Reviewed: 07/29/2015 Elsevier Patient Education  [place].

## 2020-01-29 ENCOUNTER — Other Ambulatory Visit: Payer: Self-pay | Admitting: Family Medicine

## 2020-01-29 NOTE — Telephone Encounter (Signed)
Pt aware this could be found OTC

## 2020-09-16 ENCOUNTER — Encounter: Payer: Self-pay | Admitting: Family Medicine

## 2021-01-27 ENCOUNTER — Other Ambulatory Visit: Payer: Self-pay | Admitting: Family Medicine

## 2021-08-03 ENCOUNTER — Other Ambulatory Visit: Payer: Self-pay | Admitting: Family Medicine

## 2021-11-07 NOTE — Progress Notes (Signed)
56 y.o. G19P0010   White or Caucasian Not Hispanic or Latino female here for annual exam.  No vaginal bleeding. Mild night sweats, tolerable. No vaginal dryness. No dyspareunia.   No bowel or bladder c/o.    The patient had been followed for a small complex ovarian cyst found in 5/18. It was done for a FH of ovarian cancer. Her CA 125 was mildly elevated and then normal. She declined surgery.  Last u/s was in 9/21 and showed a stable 1.7 x 1.1 cm right ovarian cyst with debris. Last CA 125 was 15.9 in 9/21  Patient's last menstrual period was 02/24/2019 (approximate).          Sexually active: Yes.    The current method of family planning is post menopausal status.    Exercising: No.  The patient does not participate in regular exercise at present. Smoker:  no  Health Maintenance: Pap:  12/30/19 WNL Hr HPV Neg 09/07/15 WNL Hr HPV Neg History of abnormal Pap:  yes years ago, no h/o cervical surgery.  MMG:  10/15/15 Bi-rads 1 neg, getting scheduled BMD:   n/a Colonoscopy: no. Doing a cologuard with her primary. TDaP:  declines  Gardasil: n/a   reports that she has never smoked. She has never used smokeless tobacco. She reports current alcohol use of about 2.0 - 3.0 standard drinks of alcohol per week. She reports that she does not use drugs. She works in Engineer, mining.   Past Medical History:  Diagnosis Date   Family history of ovarian cancer    Ovarian cyst    Pneumonia due to COVID-19 virus 05/21/2019  Mom with ovarian cancer in her late Jul 16, 2022 Patient saw genetics had a variant of uncertain significance, otherwise negative testing.  TC risk of breast cancer was 11.6%  Past Surgical History:  Procedure Laterality Date   ADENOIDECTOMY      Current Outpatient Medications  Medication Sig Dispense Refill   cholecalciferol (VITAMIN D) 1000 units tablet Take by mouth daily. 4,000 units daily     Lactobacillus (PROBIOTIC ACIDOPHILUS) CAPS Take by mouth.     Multiple Vitamin (MULTIVITAMIN) tablet  Take 1 tablet by mouth daily.     vitamin C (ASCORBIC ACID) 250 MG tablet Take 250 mg by mouth daily.     Zinc 25 MG TABS Take by mouth.     No current facility-administered medications for this visit.    Family History  Problem Relation Age of Onset   Drug abuse Mother    COPD Mother    Mental retardation Mother    Early death Mother 24       blood clot   Ovarian cancer Mother 45       dx 'late 2022/07/16', died at 87 due to COPD   Heart disease Mother    Breast cancer Paternal Aunt 67       is now in 51's   Alcohol abuse Sister    Heart disease Maternal Uncle        died late 50's   Heart attack Maternal Grandmother 3    Review of Systems  All other systems reviewed and are negative.   Exam:   BP 128/72   Ht 5' 5.5" (1.664 m)   Wt 141 lb (64 kg)   LMP 02/24/2019 (Approximate)   BMI 23.11 kg/m   Weight change: '@WEIGHTCHANGE'$ @ Height:   Height: 5' 5.5" (166.4 cm)  Ht Readings from Last 3 Encounters:  11/10/21 5' 5.5" (1.664 m)  11/10/21 5'  6" (1.676 m)  01/23/20 '5\' 6"'$  (1.676 m)    General appearance: alert, cooperative and appears stated age Head: Normocephalic, without obvious abnormality, atraumatic Neck: no adenopathy, supple, symmetrical, trachea midline and thyroid normal to inspection and palpation Lungs: clear to auscultation bilaterally Cardiovascular: regular rate and rhythm Breasts: normal appearance, no masses or tenderness Abdomen: soft, non-tender; non distended,  no masses,  no organomegaly Extremities: extremities normal, atraumatic, no cyanosis or edema Skin: Skin color, texture, turgor normal. No rashes or lesions Lymph nodes: Cervical, supraclavicular, and axillary nodes normal. No abnormal inguinal nodes palpated Neurologic: Grossly normal   Pelvic: External genitalia:  no lesions              Urethra:  normal appearing urethra with no masses, tenderness or lesions              Bartholins and Skenes: normal                 Vagina: normal  appearing vagina with normal color and discharge, no lesions              Cervix: no lesions               Bimanual Exam:  Uterus:  normal size, contour, position, consistency, mobility, non-tender              Adnexa: no mass, fullness, tenderness               Rectovaginal: Confirms               Anus:  normal sphincter tone, no lesions  Gae Dry, CMA chaperoned for the exam.  1. Well woman exam Discussed breast self exam Discussed calcium and vit D intake No pap this year Mammogram being scheduled Cologuard ordered by primary  2. History of ovarian cyst - US PELVIS TRANSVAGINAL NON-OB (TV ONLY); Future -Will consider CA 125 depending on results of ultrasound  3. Family history of ovarian cancer - US PELVIS TRANSVAGINAL NON-OB (TV ONLY); Future -Declines CA 125

## 2021-11-10 ENCOUNTER — Ambulatory Visit (INDEPENDENT_AMBULATORY_CARE_PROVIDER_SITE_OTHER): Payer: BLUE CROSS/BLUE SHIELD | Admitting: Obstetrics and Gynecology

## 2021-11-10 ENCOUNTER — Ambulatory Visit (INDEPENDENT_AMBULATORY_CARE_PROVIDER_SITE_OTHER): Payer: BLUE CROSS/BLUE SHIELD | Admitting: Family Medicine

## 2021-11-10 ENCOUNTER — Encounter: Payer: Self-pay | Admitting: Obstetrics and Gynecology

## 2021-11-10 ENCOUNTER — Encounter: Payer: Self-pay | Admitting: Family Medicine

## 2021-11-10 VITALS — BP 123/84 | HR 63 | Temp 98.0°F | Ht 66.0 in | Wt 140.0 lb

## 2021-11-10 VITALS — BP 128/72 | Ht 65.5 in | Wt 141.0 lb

## 2021-11-10 DIAGNOSIS — Z8041 Family history of malignant neoplasm of ovary: Secondary | ICD-10-CM | POA: Diagnosis not present

## 2021-11-10 DIAGNOSIS — Z0000 Encounter for general adult medical examination without abnormal findings: Secondary | ICD-10-CM | POA: Diagnosis not present

## 2021-11-10 DIAGNOSIS — Z131 Encounter for screening for diabetes mellitus: Secondary | ICD-10-CM | POA: Diagnosis not present

## 2021-11-10 DIAGNOSIS — D235 Other benign neoplasm of skin of trunk: Secondary | ICD-10-CM | POA: Insufficient documentation

## 2021-11-10 DIAGNOSIS — Z1159 Encounter for screening for other viral diseases: Secondary | ICD-10-CM

## 2021-11-10 DIAGNOSIS — Z8742 Personal history of other diseases of the female genital tract: Secondary | ICD-10-CM

## 2021-11-10 DIAGNOSIS — E782 Mixed hyperlipidemia: Secondary | ICD-10-CM | POA: Diagnosis not present

## 2021-11-10 DIAGNOSIS — Z1231 Encounter for screening mammogram for malignant neoplasm of breast: Secondary | ICD-10-CM

## 2021-11-10 DIAGNOSIS — Z01419 Encounter for gynecological examination (general) (routine) without abnormal findings: Secondary | ICD-10-CM

## 2021-11-10 DIAGNOSIS — Z1211 Encounter for screening for malignant neoplasm of colon: Secondary | ICD-10-CM

## 2021-11-10 LAB — COMPREHENSIVE METABOLIC PANEL
ALT: 12 U/L (ref 0–35)
AST: 15 U/L (ref 0–37)
Albumin: 4.9 g/dL (ref 3.5–5.2)
Alkaline Phosphatase: 50 U/L (ref 39–117)
BUN: 12 mg/dL (ref 6–23)
CO2: 30 mEq/L (ref 19–32)
Calcium: 10.2 mg/dL (ref 8.4–10.5)
Chloride: 102 mEq/L (ref 96–112)
Creatinine, Ser: 0.61 mg/dL (ref 0.40–1.20)
GFR: 101.17 mL/min (ref >60.00)
Glucose, Bld: 93 mg/dL (ref 70–99)
Potassium: 4.5 mEq/L (ref 3.5–5.1)
Sodium: 141 mEq/L (ref 135–145)
Total Bilirubin: 0.8 mg/dL (ref 0.2–1.2)
Total Protein: 7.5 g/dL (ref 6.0–8.3)

## 2021-11-10 LAB — LIPID PANEL
Cholesterol: 265 mg/dL — ABNORMAL HIGH (ref 0–200)
HDL: 82.9 mg/dL (ref >39.00)
LDL Cholesterol: 163 mg/dL — ABNORMAL HIGH (ref 0–99)
NonHDL: 181.89
Total CHOL/HDL Ratio: 3
Triglycerides: 92 mg/dL (ref 0.0–149.0)
VLDL: 18.4 mg/dL (ref 0.0–40.0)

## 2021-11-10 LAB — TSH: TSH: 3.4 u[IU]/mL (ref 0.35–5.50)

## 2021-11-10 LAB — CBC
HCT: 41.4 % (ref 36.0–46.0)
Hemoglobin: 13.6 g/dL (ref 12.0–15.0)
MCHC: 32.8 g/dL (ref 30.0–36.0)
MCV: 94.7 fl (ref 78.0–100.0)
Platelets: 240 10*3/uL (ref 150.0–400.0)
RBC: 4.38 Mil/uL (ref 3.87–5.11)
RDW: 13.7 % (ref 11.5–15.5)
WBC: 3.8 10*3/uL — ABNORMAL LOW (ref 4.0–10.5)

## 2021-11-10 LAB — HEMOGLOBIN A1C: Hgb A1c MFr Bld: 5.5 % (ref 4.6–6.5)

## 2021-11-10 NOTE — Patient Instructions (Signed)
No follow-ups on file.        Great to see you today.  I have refilled the medication(s) we provide.   If labs were collected, we will inform you of lab results once received either by echart message or telephone call.   - echart message- for normal results that have been seen by the patient already.   - telephone call: abnormal results or if patient has not viewed results in their echart.  Health Maintenance, Female Adopting a healthy lifestyle and getting preventive care are important in promoting health and wellness. Ask your health care provider about: The right schedule for you to have regular tests and exams. Things you can do on your own to prevent diseases and keep yourself healthy. What should I know about diet, weight, and exercise? Eat a healthy diet  Eat a diet that includes plenty of vegetables, fruits, low-fat dairy products, and lean protein. Do not eat a lot of foods that are high in solid fats, added sugars, or sodium. Maintain a healthy weight Body mass index (BMI) is used to identify weight problems. It estimates body fat based on height and weight. Your health care provider can help determine your BMI and help you achieve or maintain a healthy weight. Get regular exercise Get regular exercise. This is one of the most important things you can do for your health. Most adults should: Exercise for at least 150 minutes each week. The exercise should increase your heart rate and make you sweat (moderate-intensity exercise). Do strengthening exercises at least twice a week. This is in addition to the moderate-intensity exercise. Spend less time sitting. Even light physical activity can be beneficial. Watch cholesterol and blood lipids Have your blood tested for lipids and cholesterol at 55 years of age, then have this test every 5 years. Have your cholesterol levels checked more often if: Your lipid or cholesterol levels are high. You are older than 55 years of  age. You are at high risk for heart disease. What should I know about cancer screening? Depending on your health history and family history, you may need to have cancer screening at various ages. This may include screening for: Breast cancer. Cervical cancer. Colorectal cancer. Skin cancer. Lung cancer. What should I know about heart disease, diabetes, and high blood pressure? Blood pressure and heart disease High blood pressure causes heart disease and increases the risk of stroke. This is more likely to develop in people who have high blood pressure readings or are overweight. Have your blood pressure checked: Every 3-5 years if you are 18-39 years of age. Every year if you are 40 years old or older. Diabetes Have regular diabetes screenings. This checks your fasting blood sugar level. Have the screening done: Once every three years after age 40 if you are at a normal weight and have a low risk for diabetes. More often and at a younger age if you are overweight or have a high risk for diabetes. What should I know about preventing infection? Hepatitis B If you have a higher risk for hepatitis B, you should be screened for this virus. Talk with your health care provider to find out if you are at risk for hepatitis B infection. Hepatitis C Testing is recommended for: Everyone born from 1945 through 1965. Anyone with known risk factors for hepatitis C. Sexually transmitted infections (STIs) Get screened for STIs, including gonorrhea and chlamydia, if: You are sexually active and are younger than 55 years of age. You are   older than 55 years of age and your health care provider tells you that you are at risk for this type of infection. Your sexual activity has changed since you were last screened, and you are at increased risk for chlamydia or gonorrhea. Ask your health care provider if you are at risk. Ask your health care provider about whether you are at high risk for HIV. Your health  care provider may recommend a prescription medicine to help prevent HIV infection. If you choose to take medicine to prevent HIV, you should first get tested for HIV. You should then be tested every 3 months for as long as you are taking the medicine. Pregnancy If you are about to stop having your period (premenopausal) and you may become pregnant, seek counseling before you get pregnant. Take 400 to 800 micrograms (mcg) of folic acid every day if you become pregnant. Ask for birth control (contraception) if you want to prevent pregnancy. Osteoporosis and menopause Osteoporosis is a disease in which the bones lose minerals and strength with aging. This can result in bone fractures. If you are 65 years old or older, or if you are at risk for osteoporosis and fractures, ask your health care provider if you should: Be screened for bone loss. Take a calcium or vitamin D supplement to lower your risk of fractures. Be given hormone replacement therapy (HRT) to treat symptoms of menopause. Follow these instructions at home: Alcohol use Do not drink alcohol if: Your health care provider tells you not to drink. You are pregnant, may be pregnant, or are planning to become pregnant. If you drink alcohol: Limit how much you have to: 0-1 drink a day. Know how much alcohol is in your drink. In the [place] one drink equals one 12 oz bottle of beer (355 mL), one 5 oz glass of wine (148 mL), or one 1 oz glass of hard liquor (44 mL). Lifestyle Do not use any products that contain nicotine or tobacco. These products include cigarettes, chewing tobacco, and vaping devices, such as e-cigarettes. If you need help quitting, ask your health care provider. Do not use street drugs. Do not share needles. Ask your health care provider for help if you need support or information about quitting drugs. General instructions Schedule regular health, dental, and eye exams. Stay current with your vaccines. Tell your health  care provider if: You often feel depressed. You have ever been abused or do not feel safe at home. Summary Adopting a healthy lifestyle and getting preventive care are important in promoting health and wellness. Follow your health care provider's instructions about healthy diet, exercising, and getting tested or screened for diseases. Follow your health care provider's instructions on monitoring your cholesterol and blood pressure. This information is not intended to replace advice given to you by your health care provider. Make sure you discuss any questions you have with your health care provider. Document Revised: 08/30/2020 Document Reviewed: 08/30/2020 Elsevier Patient Education  2023 Elsevier Inc.  

## 2021-11-10 NOTE — Progress Notes (Signed)
Patient ID: Mary Warren, female  DOB: 01-21-1966, 56 y.o.   MRN: [id number] Patient Care Team    Relationship Specialty Notifications Start End  Ma Hillock, DO PCP - General Family Medicine  10/26/16   Salvadore Dom, MD Consulting Physician Obstetrics and Gynecology  03/25/19     Chief Complaint  Patient presents with   Annual Exam    Pt is fasting    Subjective:  Mary Warren is a 56 y.o.  Female  present for CPE  All past medical history, surgical history, allergies, family history, immunizations, medications and social history were updated in the electronic medical record today. All recent labs, ED visits and hospitalizations within the last year were reviewed.  Health maintenance:  Colonoscopy: No FHX, sheis agreeable to cologuard testing> ordered Mammogram: FH in Pat aunt July 26, 2058), Ovarian cancer (<30), pts  mammogram normal 09/2015, - agreeable today to screen > ordered at [facility] Cervical cancer screening: Ovarian cancer in mother. Pap smear 2019/07/26 w/ GYN- Dr. Talbert Nan Immunizations: td due declined, flu vaccination (encouraged yearly), shingrix declined Infectious disease screening: HIV completed Jul 26, 2010, Hep C completed today DEXA: routine screen Assistive device: none Oxygen OEU:MPNT Patient has a Dental home. Hospitalizations/ED visits: reviewed     11/10/2021    8:07 AM 03/25/2019    8:09 AM 08/07/2016    8:31 AM 09/07/2015    8:08 AM  Depression screen PHQ 2/9  Decreased Interest 0 0 0 0  Down, Depressed, Hopeless 0 0 0 0  PHQ - 2 Score 0 0 0 0       No data to display          There is no immunization history on file for this patient.  Past Medical History:  Diagnosis Date   Family history of ovarian cancer    Ovarian cyst    Pneumonia due to COVID-19 virus 05/21/2019   No Known Allergies Past Surgical History:  Procedure Laterality Date   ADENOIDECTOMY     Family History  Problem Relation Age of Onset   Drug abuse Mother    COPD Mother     Mental retardation Mother    Early death Mother 91       blood clot   Ovarian cancer Mother 85       dx 87late 2022-07-26', died at 87 due to COPD   Heart disease Mother    Breast cancer Paternal Aunt 25       is now in 67's   Alcohol abuse Sister    Heart disease Maternal Uncle        died late 30's   Heart attack Maternal Grandmother 52   Social History   Social History Narrative   ** Merged History Encounter **       Husband, Geneticist, 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  Smoke detector in the home. Wears her set belt. Feels safe in her relationships.      Allergies as of 11/10/2021   No Known Allergies      Medication List        Accurate as of November 10, 2021  8:30 AM. If you have any questions, ask your nurse or doctor.          STOP taking these medications    ADRENAL PO Stopped by: Howard Pouch, DO   Ciclopirox-Salicylic Acid 6.14-4 % Sham Stopped by: Howard Pouch, DO   ketoconazole 2 % shampoo Commonly known as: NIZORAL Stopped by:  Taeya Theall, DO   triamcinolone 0.147 MG/GM topical spray Commonly known as: KENALOG Stopped by: Howard Pouch, DO       TAKE these medications    cholecalciferol 1000 units tablet Commonly known as: VITAMIN D Take by mouth daily. 4,000 units daily   multivitamin tablet Take 1 tablet by mouth daily.   Probiotic Acidophilus Caps Take by mouth.   vitamin C 250 MG tablet Commonly known as: ASCORBIC ACID Take 250 mg by mouth daily.   Zinc 25 MG Tabs Take by mouth.        All past medical history, surgical history, allergies, family history, immunizations andmedications were updated in the EMR today and reviewed under the history and medication portions of their EMR.     No results found for this or any previous visit (from the past 2160 hour(s)).  No results found.   Review of Systems  All other systems reviewed and are negative.  14 pt review of systems performed  and negative (unless mentioned in an HPI)  Objective: BP 123/84   Pulse 63   Temp 98 F (36.7 C) (Oral)   Ht '5\' 6"'$  (1.676 m)   Wt 140 lb (63.5 kg)   LMP 02/24/2019 (Approximate)   SpO2 99%   BMI 22.60 kg/m  Physical Exam Vitals and nursing note reviewed.  Constitutional:      General: She is not in acute distress.    Appearance: Normal appearance. She is not ill-appearing or toxic-appearing.  HENT:     Head: Normocephalic and atraumatic.     Right Ear: Tympanic membrane, ear canal and external ear normal. There is no impacted cerumen.     Left Ear: Tympanic membrane, ear canal and external ear normal. There is no impacted cerumen.     Nose: No congestion or rhinorrhea.     Mouth/Throat:     Mouth: Mucous membranes are moist.     Pharynx: Oropharynx is clear. No oropharyngeal exudate or posterior oropharyngeal erythema.  Eyes:     General: No scleral icterus.       Right eye: No discharge.        Left eye: No discharge.     Extraocular Movements: Extraocular movements intact.     Conjunctiva/sclera: Conjunctivae normal.     Pupils: Pupils are equal, round, and reactive to light.  Cardiovascular:     Rate and Rhythm: Normal rate and regular rhythm.     Pulses: Normal pulses.     Heart sounds: Normal heart sounds. No murmur heard.    No friction rub. No gallop.  Pulmonary:     Effort: Pulmonary effort is normal. No respiratory distress.     Breath sounds: Normal breath sounds. No stridor. No wheezing, rhonchi or rales.  Chest:     Chest wall: No tenderness.  Abdominal:     General: Abdomen is flat. Bowel sounds are normal. There is no distension.     Palpations: Abdomen is soft. There is no mass.     Tenderness: There is no abdominal tenderness. There is no right CVA tenderness, left CVA tenderness, guarding or rebound.     Hernia: No hernia is present.  Musculoskeletal:        General: No swelling, tenderness or deformity. Normal range of motion.     Cervical back:  Normal range of motion and neck supple. No rigidity or tenderness.     Right lower leg: No edema.     Left lower leg: No edema.  Comments: Left 2nd toe: 72m cystic structure ext tendon. No erythema.   Lymphadenopathy:     Cervical: No cervical adenopathy.  Skin:    General: Skin is warm and dry.     Coloration: Skin is not jaundiced or pale.     Findings: No bruising, erythema, lesion or rash.  Neurological:     General: No focal deficit present.     Mental Status: She is alert and oriented to person, place, and time. Mental status is at baseline.     Cranial Nerves: No cranial nerve deficit.     Sensory: No sensory deficit.     Motor: No weakness.     Coordination: Coordination normal.     Gait: Gait normal.     Deep Tendon Reflexes: Reflexes normal.  Psychiatric:        Mood and Affect: Mood normal.        Behavior: Behavior normal.        Thought Content: Thought content normal.        Judgment: Judgment normal.      No results found.  Assessment/plan: LRoneshia Drewis a 56y.o. female present for CPE  Mixed hyperlipidemia - CBC - Comprehensive metabolic panel - Lipid panel - TSH Diabetes mellitus screening - Hemoglobin A1c Breast cancer screening by mammogram - MM 3D SCREEN BREAST BILATERAL; Future Encounter for hepatitis C screening test for low risk patient - Hepatitis C antibody Colon cancer screening - Cologuard Encounter for preventive health examination Colonoscopy: No FHX, sheis agreeable to cologuard testing> ordered Mammogram: FH in Pat aunt (60), Ovarian cancer (<30), pts  mammogram normal 09/2015, - agreeable today to screen > ordered at [facility] Cervical cancer screening: Ovarian cancer in mother. Pap smear 2021 w/ GYN- Dr. JTalbert NanImmunizations: td due declined, flu vaccination (encouraged yearly), shingrix declined Infectious disease screening: HIV completed 2012, Hep C completed today DEXA: routine screen Patient was encouraged to exercise greater  than 150 minutes a week. Patient was encouraged to choose a diet filled with fresh fruits and vegetables, and lean meats. AVS provided to patient today for education/recommendation on gender specific health and safety maintenance. Return in about 1 year (around 11/12/2022) for cpe (20 min).    Orders Placed This Encounter  Procedures   MM 3D SCREEN BREAST BILATERAL   CBC   Comprehensive metabolic panel   Hemoglobin A1c   Lipid panel   TSH   Cologuard   Hepatitis C antibody   No orders of the defined types were placed in this encounter.  Referral Orders  No referral(s) requested today     Electronically signed by: RHoward Pouch DChapman

## 2021-11-10 NOTE — Patient Instructions (Signed)

## 2021-11-11 LAB — HEPATITIS C ANTIBODY: Hepatitis C Ab: NONREACTIVE

## 2021-12-03 LAB — COLOGUARD: COLOGUARD: NEGATIVE

## 2021-12-20 ENCOUNTER — Encounter: Payer: Self-pay | Admitting: Obstetrics and Gynecology

## 2021-12-20 ENCOUNTER — Telehealth: Payer: Self-pay

## 2021-12-20 ENCOUNTER — Ambulatory Visit (INDEPENDENT_AMBULATORY_CARE_PROVIDER_SITE_OTHER): Payer: BLUE CROSS/BLUE SHIELD

## 2021-12-20 ENCOUNTER — Ambulatory Visit (INDEPENDENT_AMBULATORY_CARE_PROVIDER_SITE_OTHER): Payer: BLUE CROSS/BLUE SHIELD | Admitting: Obstetrics and Gynecology

## 2021-12-20 VITALS — BP 132/84 | HR 76 | Ht 65.5 in | Wt 140.0 lb

## 2021-12-20 DIAGNOSIS — Z8742 Personal history of other diseases of the female genital tract: Secondary | ICD-10-CM

## 2021-12-20 DIAGNOSIS — Z8041 Family history of malignant neoplasm of ovary: Secondary | ICD-10-CM

## 2021-12-20 DIAGNOSIS — N9489 Other specified conditions associated with female genital organs and menstrual cycle: Secondary | ICD-10-CM

## 2021-12-20 NOTE — Progress Notes (Signed)
GYNECOLOGY  VISIT   HPI: 56 y.o.   Married White or Caucasian Not Hispanic or Latino  female   G1P0010 with Patient's last menstrual period was 02/24/2019 (approximate).   here for f/u imaging. The patient had been followed for a small complex ovarian cyst found in 5/18. It was done for a FH of ovarian cancer. Her CA 125 was mildly elevated and then normal. She declined surgery.  Last u/s was in 9/21 and showed a stable 1.7 x 1.1 cm right ovarian cyst with debris. Last CA 125 was 15.9 in 9/21  GYNECOLOGIC HISTORY: Patient's last menstrual period was 02/24/2019 (approximate). 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 11/10/2021   Mixed hyperlipidemia 06/24/2019   FH: heart disease 06/24/2019   Seborrheic dermatitis of scalp 03/25/2019   Genetic testing 11/08/2016   Family history of ovarian cancer    Endometrioma of ovary 09/08/2016   High risk of ovarian cancer 08/07/2016   Encounter for preventive health examination 09/07/2015   Skin lesion of breast 09/07/2015    Past Medical History:  Diagnosis Date   Family history of ovarian cancer    Ovarian cyst    Pneumonia due to COVID-19 virus 05/21/2019    Past Surgical History:  Procedure Laterality Date   ADENOIDECTOMY      Current Outpatient Medications  Medication Sig Dispense Refill   cholecalciferol (VITAMIN D) 1000 units tablet Take by mouth daily. 4,000 units daily     Lactobacillus (PROBIOTIC ACIDOPHILUS) CAPS Take by mouth.     Multiple Vitamin (MULTIVITAMIN) tablet Take 1 tablet by mouth daily.     vitamin C (ASCORBIC ACID) 250 MG tablet Take 250 mg by mouth daily.     Zinc 25 MG TABS Take by mouth.     No current  facility-administered medications for this visit.     ALLERGIES: Patient has no known allergies.  Family History  Problem Relation Age of Onset   Drug abuse Mother    COPD Mother    Mental retardation Mother    Early death Mother 41       blood clot   Ovarian cancer Mother 21       dx 'late 07/25/2022', died at 40 due to COPD   Heart disease Mother    Breast cancer Paternal Aunt 5       is now in 42's   Alcohol abuse Sister    Heart disease Maternal Uncle        died late 3's   Heart attack Maternal Grandmother 52    Social History   Socioeconomic History   Marital status: Married    Spouse name: Not on file   Number of children: Not on file   Years of education: Not on file   Highest education level: Not on file  Occupational History   Not on file  Tobacco Use   Smoking status: Never   Smokeless tobacco:  Never  Vaping Use   Vaping Use: Never used  Substance and Sexual Activity   Alcohol use: Yes    Alcohol/week: 2.0 - 3.0 standard drinks of alcohol    Types: 2 - 3 Glasses of wine per week   Drug use: No   Sexual activity: Yes    Partners: Male    Birth control/protection: Other-see comments    Comment: Husband had a vasectomy  Other Topics Concern   Not on file  Social History Narrative   ** Merged History Encounter **       Husband, 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  Smoke detector in the home. Wears her set belt. Feels safe in her relationships.     Social Determinants of Health   Financial Resource Strain: Not on file  Food Insecurity: Not on file  Transportation Needs: Not on file  Physical Activity: Not on file  Stress: Not on file  Social Connections: Not on file  Intimate Partner Violence: Not on file    ROS  PHYSICAL EXAMINATION:    LMP 02/24/2019 (Approximate)     General appearance: alert, cooperative and appears stated age  U/S images reviewed, see report.  1. History of ovarian  cyst The right ovary is enlarged, with solid and cystic components, avascular. This is a change in appearance. Patient is PMP. LMP in 11/20.  - CA 125 -Will send to GYN for a consultation  2. Family history of ovarian cancer - CA 125

## 2021-12-20 NOTE — Telephone Encounter (Signed)
Referral placed in Epic with Dr. Berline Lopes Concha [place] [facility].

## 2021-12-20 NOTE — Telephone Encounter (Signed)
Message Received: Today Salvadore Dom, [place] Triage Please get this patient an appointment with Dr Berline Lopes, she is PMP with a complex right adnexal mass and a FH of ovarian cancer. CA 125 is pending.  Thanks,

## 2021-12-21 ENCOUNTER — Encounter: Payer: Self-pay | Admitting: Gynecologic Oncology

## 2021-12-21 LAB — CA 125: CA 125: 11 U/mL (ref <35)

## 2021-12-21 NOTE — Telephone Encounter (Signed)
Patient has been scheduled with Dr. Berline Lopes for 12/22/21 at 11:15am.  CA 125 is in chart and is normal.

## 2021-12-22 ENCOUNTER — Inpatient Hospital Stay: Payer: BLUE CROSS/BLUE SHIELD | Attending: Gynecologic Oncology | Admitting: Gynecologic Oncology

## 2021-12-22 ENCOUNTER — Other Ambulatory Visit: Payer: Self-pay | Admitting: Gynecologic Oncology

## 2021-12-22 ENCOUNTER — Encounter: Payer: Self-pay | Admitting: Gynecologic Oncology

## 2021-12-22 ENCOUNTER — Other Ambulatory Visit: Payer: Self-pay

## 2021-12-22 VITALS — BP 160/78 | HR 71 | Temp 98.4°F | Resp 16 | Ht 65.0 in | Wt 140.5 lb

## 2021-12-22 DIAGNOSIS — D3911 Neoplasm of uncertain behavior of right ovary: Secondary | ICD-10-CM | POA: Diagnosis not present

## 2021-12-22 DIAGNOSIS — N9489 Other specified conditions associated with female genital organs and menstrual cycle: Secondary | ICD-10-CM

## 2021-12-22 DIAGNOSIS — D250 Submucous leiomyoma of uterus: Secondary | ICD-10-CM | POA: Insufficient documentation

## 2021-12-22 DIAGNOSIS — Z8041 Family history of malignant neoplasm of ovary: Secondary | ICD-10-CM | POA: Diagnosis not present

## 2021-12-22 DIAGNOSIS — F419 Anxiety disorder, unspecified: Secondary | ICD-10-CM | POA: Insufficient documentation

## 2021-12-22 DIAGNOSIS — D251 Intramural leiomyoma of uterus: Secondary | ICD-10-CM | POA: Insufficient documentation

## 2021-12-22 DIAGNOSIS — Z780 Asymptomatic menopausal state: Secondary | ICD-10-CM | POA: Insufficient documentation

## 2021-12-22 NOTE — Progress Notes (Signed)
GYNECOLOGIC ONCOLOGY NEW PATIENT CONSULTATION   Patient Name: Mary Warren  Patient Age: 56 y.o. Date of Service: 12/22/21 Referring Provider: Dr. Sumner Boast  Primary Care Provider: Ma Hillock, DO Consulting Provider: Jeral Pinch, MD   Assessment/Plan:  Postmenopausal patient with complex adnexal mass that has changed on imaging in the setting of a first-degree relative with ovarian cancer.  I discussed in detail with the patient and her husband findings from recent ultrasound.  We reviewed these pictures together.  There appears to be mild enlargement of the right adnexa with at least several cystic versus complex components.  This was noted to be a change from prior ultrasounds.  The patient has been getting regular ultrasound follow-up due to a cystic lesion noted in 2018 and in the setting of her family history of ovarian cancer.  Her CA-125 was elevated several years ago.  At that point, she opted for close surveillance instead of surgery given her premenopausal stage.  Her CA-125 normalized and continues to be normal.  We discussed that although this tumor marker is normal currently, this is not a tool that can be used for diagnostic purposes.  It can be normal in up to 50% of early stage ovarian cancer and can be elevated in many noncancerous processes.  The patient is more open to a discussion about surgery as she is now postmenopausal but still has some hesitation about moving forward with surgery.  I offered 2 treatment options.    The first would be to proceed with robotic bilateral salpingo-oophorectomy with plan to send enlarged adnexa for frozen section.  Alternatively, I could remove 1 adnexa in the contralateral fallopian tube.  As long as the enlarged ovary is benign, the other ovary could remain in situ if the patient feels strongly about keeping that ovary.  If frozen section revealed a borderline tumor, then we discussed recommendation for staging including +/-  hysterectomy as well as peritoneal biopsies and omentectomy.  In the setting of malignancy, then staging would also include lymphadenectomy.  The second option would be to proceed with MRI to help better characterize the adnexa.  I recommended tentatively scheduling surgery for mid September in the event that the MRI has worrisome features for possible malignancy.  If MRI provides reassurance, then the patient could ultimately decide to move forward with scheduled surgery or delay/rethink the idea of surgery.  Although the patient has had genetic testing that was negative, given her family history, she is at increased risk of ovarian cancer.  I think even if MRI findings are reassuring, it would be reasonable to move forward with bilateral salpingo-oophorectomy given her postmenopausal status.  I will call the patient once I have reviewed her MRI.  At that time, if we are moving forward with surgery, I will discuss further with her the risks of surgery.  She will come back for an in person visit for perioperative counseling.  A copy of this note was sent to the patient's referring provider.   75 minutes of total time was spent for this patient encounter, including preparation, face-to-face counseling with the patient and coordination of care, and documentation of the encounter.   Jeral Pinch, MD  Division of Gynecologic Oncology  Department of Obstetrics and Gynecology  [facility]  ___________________________________________  Chief Complaint: No chief complaint on file.   History of Present Illness:  Mary Warren is a 56 y.o. y.o. female who is seen in consultation at the request of Dr. Talbert Nan  for an evaluation of a complex adnexal mass.  Patient has been followed by her OB/GYN for a cystic lesion on the right ovary since 2018.  This measured approximately 3 cm and was thought to have features that represented an endometrioma or hemorrhagic cyst.  In 2019,  patient's CA-125 was elevated to a max of 110.  This ultimately normalized by early 2020.  In 2019, she saw a GYN oncologist, Dr. Gerarda Fraction, in the setting of her elevated tumor marker and increased size of the adnexal cyst.  Surgical and nonsurgical options were discussed with the patient and ultimately her preference was to continue with close surveillance with her gynecologist.    Recently, she was seen this week and had a repeat pelvic ultrasound performed.  The right ovary measured just under 4 cm.  An avascular mass with cystic and solid component measured up to 1.5 cm.  Additionally, a avascular cystic and solid mass measuring 1.7 cm and an additional solid-appearing mass measuring 1.2 cm were also noted.  CA-125 on 12/20/21 was 11.  Patient reports feeling well.  She denies any abdominal or pelvic pain.  She reports normal bowel and bladder function.  She endorses a good appetite, denies any recent weight changes or early satiety.  Denies any nausea or emesis.   Component Ref Range & Units 1 yr ago (01/13/20) 2 yr ago (02/18/19) 3 yr ago (08/15/18) 3 yr ago (04/15/18) 3 yr ago (02/12/18) 4 yr ago (08/07/17) 4 yr ago (03/06/17)  Cancer Antigen (CA) 125 0.0 - 38.1 U/mL 15.9  29.0 CM  32.8 CM  54.7 High  CM  110.0 High  CM  50.1 High  CM  34.7 CM   Patient's family history is notable for ovarian cancer in her mother.  This was diagnosed in her mother's late 24s.  Type of cancer is unknown.  The patient's mother lived into her late 40s and ultimately died from a pulmonary embolism that it sounds like was related to her other comorbidities.  The patient had genetic testing in 2018 with no pathogenic mutations noted. 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  PAST MEDICAL HISTORY:  Past Medical History:  Diagnosis Date   Adnexal mass    Family history of ovarian cancer    Ovarian cyst    Pneumonia due to COVID-19 virus 05/21/2019     PAST SURGICAL HISTORY:  Past Surgical History:  Procedure  Laterality Date   ADENOIDECTOMY      OB/GYN HISTORY:  OB History  Gravida Para Term Preterm AB Living  1 0 0 0 1    SAB IAB Ectopic Multiple Live Births  0 0 0        # Outcome Date GA Lbr Len/2nd Weight Sex Delivery Anes PTL Lv  1 AB             Patient's last menstrual period was 02/24/2019 (approximate).  Age at menarche: 2 Age at menopause: 47 Hx of HRT: Denies Hx of STDs: Denies Last pap: 12/2019 - NIML, HR HPV negative History of abnormal pap smears: yes, remote history; not require any biopsies or procedures on her cervix  SCREENING STUDIES:  Last mammogram: 2017 - scheduled for 12/2021  Last colonoscopy: n/a, cologuard negative 11/2021  MEDICATIONS: Outpatient Encounter Medications as of 12/22/2021  Medication Sig   Cholecalciferol (VITAMIN D) 50 MCG (2000 UT) tablet Take 4,000 Units by mouth daily.   Lactobacillus (PROBIOTIC ACIDOPHILUS) CAPS Take 1 capsule by mouth daily.   Multiple Vitamin (  MULTIVITAMIN) tablet Take 1 tablet by mouth daily.   vitamin C (ASCORBIC ACID) 250 MG tablet Take 250 mg by mouth daily.   Zinc 25 MG TABS Take 25 mg by mouth daily.   No facility-administered encounter medications on file as of 12/22/2021.    ALLERGIES:  No Known Allergies   FAMILY HISTORY:  Family History  Problem Relation Age of Onset   Drug abuse Mother    COPD Mother    Mental retardation Mother    Early death Mother 45       blood clot   Ovarian cancer Mother 70       dx 'late 2022-07-20', died at 33 due to COPD   Heart disease Mother    Breast cancer Paternal Aunt 73       is now in 30's   Alcohol abuse Sister    Heart disease Maternal Uncle        died late 43's   Heart attack Maternal Grandmother 12     SOCIAL HISTORY:  Social Connections: Not on file    REVIEW OF SYSTEMS:  Denies appetite changes, fevers, chills, fatigue, unexplained weight changes. Denies hearing loss, neck lumps or masses, mouth sores, ringing in ears or voice changes. Denies cough  or wheezing.  Denies shortness of breath. Denies chest pain or palpitations. Denies leg swelling. Denies abdominal distention, pain, blood in stools, constipation, diarrhea, nausea, vomiting, or early satiety. Denies pain with intercourse, dysuria, frequency, hematuria or incontinence. Denies hot flashes, pelvic pain, vaginal bleeding or vaginal discharge.   Denies joint pain, back pain or muscle pain/cramps. Denies itching, rash, or wounds. Denies dizziness, headaches, numbness or seizures. Denies swollen lymph nodes or glands, denies easy bruising or bleeding. Denies anxiety, depression, confusion, or decreased concentration.  Physical Exam:  Vital Signs for this encounter:  Blood pressure (!) 160/78, pulse 71, temperature 98.4 F (36.9 C), temperature source Oral, resp. rate 16, height '5\' 5"'$  (1.651 m), weight 140 lb 8 oz (63.7 kg), last menstrual period 02/24/2019, SpO2 100 %. Body mass index is 23.38 kg/m. General: Alert, oriented, no acute distress.  HEENT: Normocephalic, atraumatic. Sclera anicteric.  Chest: Clear to auscultation bilaterally. No wheezes, rhonchi, or rales. Cardiovascular: Regular rate and rhythm, no murmurs, rubs, or gallops.  Abdomen: Normoactive bowel sounds. Soft, nondistended, nontender to palpation. No masses or hepatosplenomegaly appreciated. No palpable fluid wave.  Extremities: Grossly normal range of motion. Warm, well perfused. No edema bilaterally.  Skin: No rashes or lesions.  Lymphatics: No cervical, supraclavicular, or inguinal adenopathy.  GU:  Normal external female genitalia. No lesions. No discharge or bleeding.             Bladder/urethra:  No lesions or masses, well supported bladder             Vagina: No masses or lesions.  Well rugated.             Cervix: Normal appearing, no lesions.  Mildly atrophic.             Uterus: Small, mobile, no parametrial involvement or nodularity.             Adnexa: Fullness noted in the right adnexa, no  nodularity, mobile.   Rectal: Deferred.  LABORATORY AND RADIOLOGIC DATA:  Outside medical records were reviewed to synthesize the above history, along with the history and physical obtained during the visit.   Lab Results  Component Value Date   WBC 3.8 (L) 11/10/2021   HGB 13.6  11/10/2021   HCT 41.4 11/10/2021   PLT 240.0 11/10/2021   GLUCOSE 93 11/10/2021   CHOL 265 (H) 11/10/2021   TRIG 92.0 11/10/2021   HDL 82.90 11/10/2021   LDLCALC 163 (H) 11/10/2021   ALT 12 11/10/2021   AST 15 11/10/2021   NA 141 11/10/2021   K 4.5 11/10/2021   CL 102 11/10/2021   CREATININE 0.61 11/10/2021   BUN 12 11/10/2021   CO2 30 11/10/2021   TSH 3.40 11/10/2021   HGBA1C 5.5 11/10/2021   Pelvic ultrasound 8/29:   Findings:   Uterus 6.65 x 4.47 x 3.45 cm, retroverted Fibroids: 1) 1.47 x 0.93 cm, intramural 2) 1.10 x 0.77 cm, intramural 3) 1.84 x 1.40 cm, intramural 4) 1.63 x 2.07 cm, intramural, subserosal 5) 0.86 x 0.79 cm, intramural, subserosal   Endometrium 1 mm, no masses   Left ovary 2.77 x 1.81 x 0.82 cm, atrophic, + perfusion   Right ovary 3.72 x 1.98 x 1.45 cm, + perfusion Avascular mass with cystic and solid components measures 1.5 x 1.08 x 1.37 cm 2 other masses are noted 1.7 x 1.0 x 1.4 cm mass appears solid/cystic, avascular 1.2 x 0.8 x 1.1 cm mass, appears solid, avascular     Trace free fluid     Impression:  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

## 2021-12-22 NOTE — H&P (View-Only) (Signed)
GYNECOLOGIC ONCOLOGY NEW PATIENT CONSULTATION   Patient Name: Mary Warren  Patient Age: 56 y.o. Date of Service: 12/22/21 Referring Provider: Dr. Sumner Boast  Primary Care Provider: Ma Hillock, DO Consulting Provider: Jeral Pinch, MD   Assessment/Plan:  Postmenopausal patient with complex adnexal mass that has changed on imaging in the setting of a first-degree relative with ovarian cancer.  I discussed in detail with the patient and her husband findings from recent ultrasound.  We reviewed these pictures together.  There appears to be mild enlargement of the right adnexa with at least several cystic versus complex components.  This was noted to be a change from prior ultrasounds.  The patient has been getting regular ultrasound follow-up due to a cystic lesion noted in 2018 and in the setting of her family history of ovarian cancer.  Her CA-125 was elevated several years ago.  At that point, she opted for close surveillance instead of surgery given her premenopausal stage.  Her CA-125 normalized and continues to be normal.  We discussed that although this tumor marker is normal currently, this is not a tool that can be used for diagnostic purposes.  It can be normal in up to 50% of early stage ovarian cancer and can be elevated in many noncancerous processes.  The patient is more open to a discussion about surgery as she is now postmenopausal but still has some hesitation about moving forward with surgery.  I offered 2 treatment options.    The first would be to proceed with robotic bilateral salpingo-oophorectomy with plan to send enlarged adnexa for frozen section.  Alternatively, I could remove 1 adnexa in the contralateral fallopian tube.  As long as the enlarged ovary is benign, the other ovary could remain in situ if the patient feels strongly about keeping that ovary.  If frozen section revealed a borderline tumor, then we discussed recommendation for staging including +/-  hysterectomy as well as peritoneal biopsies and omentectomy.  In the setting of malignancy, then staging would also include lymphadenectomy.  The second option would be to proceed with MRI to help better characterize the adnexa.  I recommended tentatively scheduling surgery for mid September in the event that the MRI has worrisome features for possible malignancy.  If MRI provides reassurance, then the patient could ultimately decide to move forward with scheduled surgery or delay/rethink the idea of surgery.  Although the patient has had genetic testing that was negative, given her family history, she is at increased risk of ovarian cancer.  I think even if MRI findings are reassuring, it would be reasonable to move forward with bilateral salpingo-oophorectomy given her postmenopausal status.  I will call the patient once I have reviewed her MRI.  At that time, if we are moving forward with surgery, I will discuss further with her the risks of surgery.  She will come back for an in person visit for perioperative counseling.  A copy of this note was sent to the patient's referring provider.   75 minutes of total time was spent for this patient encounter, including preparation, face-to-face counseling with the patient and coordination of care, and documentation of the encounter.   Jeral Pinch, MD  Division of Gynecologic Oncology  Department of Obstetrics and Gynecology  [facility]  ___________________________________________  Chief Complaint: No chief complaint on file.   History of Present Illness:  Mary Warren is a 56 y.o. y.o. female who is seen in consultation at the request of Dr. Talbert Nan  for an evaluation of a complex adnexal mass.  Patient has been followed by her OB/GYN for a cystic lesion on the right ovary since 2018.  This measured approximately 3 cm and was thought to have features that represented an endometrioma or hemorrhagic cyst.  In 2019,  patient's CA-125 was elevated to a max of 110.  This ultimately normalized by early 2020.  In 2019, she saw a GYN oncologist, Dr. Gerarda Fraction, in the setting of her elevated tumor marker and increased size of the adnexal cyst.  Surgical and nonsurgical options were discussed with the patient and ultimately her preference was to continue with close surveillance with her gynecologist.    Recently, she was seen this week and had a repeat pelvic ultrasound performed.  The right ovary measured just under 4 cm.  An avascular mass with cystic and solid component measured up to 1.5 cm.  Additionally, a avascular cystic and solid mass measuring 1.7 cm and an additional solid-appearing mass measuring 1.2 cm were also noted.  CA-125 on 12/20/21 was 11.  Patient reports feeling well.  She denies any abdominal or pelvic pain.  She reports normal bowel and bladder function.  She endorses a good appetite, denies any recent weight changes or early satiety.  Denies any nausea or emesis.   Component Ref Range & Units 1 yr ago (01/13/20) 2 yr ago (02/18/19) 3 yr ago (08/15/18) 3 yr ago (04/15/18) 3 yr ago (02/12/18) 4 yr ago (08/07/17) 4 yr ago (03/06/17)  Cancer Antigen (CA) 125 0.0 - 38.1 U/mL 15.9  29.0 CM  32.8 CM  54.7 High  CM  110.0 High  CM  50.1 High  CM  34.7 CM   Patient's family history is notable for ovarian cancer in her mother.  This was diagnosed in her mother's late 2s.  Type of cancer is unknown.  The patient's mother lived into her late 29s and ultimately died from a pulmonary embolism that it sounds like was related to her other comorbidities.  The patient had genetic testing in 2018 with no pathogenic mutations noted. 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  PAST MEDICAL HISTORY:  Past Medical History:  Diagnosis Date   Adnexal mass    Family history of ovarian cancer    Ovarian cyst    Pneumonia due to COVID-19 virus 05/21/2019     PAST SURGICAL HISTORY:  Past Surgical History:  Procedure  Laterality Date   ADENOIDECTOMY      OB/GYN HISTORY:  OB History  Gravida Para Term Preterm AB Living  1 0 0 0 1    SAB IAB Ectopic Multiple Live Births  0 0 0        # Outcome Date GA Lbr Len/2nd Weight Sex Delivery Anes PTL Lv  1 AB             Patient's last menstrual period was 02/24/2019 (approximate).  Age at menarche: 53 Age at menopause: 60 Hx of HRT: Denies Hx of STDs: Denies Last pap: 12/2019 - NIML, HR HPV negative History of abnormal pap smears: yes, remote history; not require any biopsies or procedures on her cervix  SCREENING STUDIES:  Last mammogram: 2017 - scheduled for 12/2021  Last colonoscopy: n/a, cologuard negative 11/2021  MEDICATIONS: Outpatient Encounter Medications as of 12/22/2021  Medication Sig   Cholecalciferol (VITAMIN D) 50 MCG (2000 UT) tablet Take 4,000 Units by mouth daily.   Lactobacillus (PROBIOTIC ACIDOPHILUS) CAPS Take 1 capsule by mouth daily.   Multiple Vitamin (  MULTIVITAMIN) tablet Take 1 tablet by mouth daily.   vitamin C (ASCORBIC ACID) 250 MG tablet Take 250 mg by mouth daily.   Zinc 25 MG TABS Take 25 mg by mouth daily.   No facility-administered encounter medications on file as of 12/22/2021.    ALLERGIES:  No Known Allergies   FAMILY HISTORY:  Family History  Problem Relation Age of Onset   Drug abuse Mother    COPD Mother    Mental retardation Mother    Early death Mother 75       blood clot   Ovarian cancer Mother 86       dx 'late July 24, 2022', died at 14 due to COPD   Heart disease Mother    Breast cancer Paternal Aunt 32       is now in 37's   Alcohol abuse Sister    Heart disease Maternal Uncle        died late 90's   Heart attack Maternal Grandmother 13     SOCIAL HISTORY:  Social Connections: Not on file    REVIEW OF SYSTEMS:  Denies appetite changes, fevers, chills, fatigue, unexplained weight changes. Denies hearing loss, neck lumps or masses, mouth sores, ringing in ears or voice changes. Denies cough  or wheezing.  Denies shortness of breath. Denies chest pain or palpitations. Denies leg swelling. Denies abdominal distention, pain, blood in stools, constipation, diarrhea, nausea, vomiting, or early satiety. Denies pain with intercourse, dysuria, frequency, hematuria or incontinence. Denies hot flashes, pelvic pain, vaginal bleeding or vaginal discharge.   Denies joint pain, back pain or muscle pain/cramps. Denies itching, rash, or wounds. Denies dizziness, headaches, numbness or seizures. Denies swollen lymph nodes or glands, denies easy bruising or bleeding. Denies anxiety, depression, confusion, or decreased concentration.  Physical Exam:  Vital Signs for this encounter:  Blood pressure (!) 160/78, pulse 71, temperature 98.4 F (36.9 C), temperature source Oral, resp. rate 16, height '5\' 5"'$  (1.651 m), weight 140 lb 8 oz (63.7 kg), last menstrual period 02/24/2019, SpO2 100 %. Body mass index is 23.38 kg/m. General: Alert, oriented, no acute distress.  HEENT: Normocephalic, atraumatic. Sclera anicteric.  Chest: Clear to auscultation bilaterally. No wheezes, rhonchi, or rales. Cardiovascular: Regular rate and rhythm, no murmurs, rubs, or gallops.  Abdomen: Normoactive bowel sounds. Soft, nondistended, nontender to palpation. No masses or hepatosplenomegaly appreciated. No palpable fluid wave.  Extremities: Grossly normal range of motion. Warm, well perfused. No edema bilaterally.  Skin: No rashes or lesions.  Lymphatics: No cervical, supraclavicular, or inguinal adenopathy.  GU:  Normal external female genitalia. No lesions. No discharge or bleeding.             Bladder/urethra:  No lesions or masses, well supported bladder             Vagina: No masses or lesions.  Well rugated.             Cervix: Normal appearing, no lesions.  Mildly atrophic.             Uterus: Small, mobile, no parametrial involvement or nodularity.             Adnexa: Fullness noted in the right adnexa, no  nodularity, mobile.   Rectal: Deferred.  LABORATORY AND RADIOLOGIC DATA:  Outside medical records were reviewed to synthesize the above history, along with the history and physical obtained during the visit.   Lab Results  Component Value Date   WBC 3.8 (L) 11/10/2021   HGB 13.6  11/10/2021   HCT 41.4 11/10/2021   PLT 240.0 11/10/2021   GLUCOSE 93 11/10/2021   CHOL 265 (H) 11/10/2021   TRIG 92.0 11/10/2021   HDL 82.90 11/10/2021   LDLCALC 163 (H) 11/10/2021   ALT 12 11/10/2021   AST 15 11/10/2021   NA 141 11/10/2021   K 4.5 11/10/2021   CL 102 11/10/2021   CREATININE 0.61 11/10/2021   BUN 12 11/10/2021   CO2 30 11/10/2021   TSH 3.40 11/10/2021   HGBA1C 5.5 11/10/2021   Pelvic ultrasound 8/29:   Findings:   Uterus 6.65 x 4.47 x 3.45 cm, retroverted Fibroids: 1) 1.47 x 0.93 cm, intramural 2) 1.10 x 0.77 cm, intramural 3) 1.84 x 1.40 cm, intramural 4) 1.63 x 2.07 cm, intramural, subserosal 5) 0.86 x 0.79 cm, intramural, subserosal   Endometrium 1 mm, no masses   Left ovary 2.77 x 1.81 x 0.82 cm, atrophic, + perfusion   Right ovary 3.72 x 1.98 x 1.45 cm, + perfusion Avascular mass with cystic and solid components measures 1.5 x 1.08 x 1.37 cm 2 other masses are noted 1.7 x 1.0 x 1.4 cm mass appears solid/cystic, avascular 1.2 x 0.8 x 1.1 cm mass, appears solid, avascular     Trace free fluid     Impression:  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

## 2021-12-22 NOTE — Patient Instructions (Addendum)
Plan to have an MRI of the pelvis and Dr. Berline Lopes will contact you with the results. Based on the results, the surgery scheduled below can always be adjusted.  We will plan to have a pre-op discussion over the phone closer to the surgery date with Lenna Sciara our Nurse Practitioner to discuss the instructions below in detail.   Preparing for your Surgery  Plan for surgery on January 04, 2022 with Dr. Jeral Pinch at [facility] will be scheduled for robotic assisted laparoscopic bilateral salpingo-oophorectomy (removal of both ovaries and fallopian tubes), possible staging if a pre-cancer or cancer is identified including total laparoscopic hysterectomy (removal of the uterus and cervix), possible laparotomy (larger incision on your abdomen if needed).   Pre-operative Testing -You will receive a phone call from presurgical testing at [facility] to arrange for a pre-operative appointment and lab work.  -Bring your insurance card, copy of an advanced directive if applicable, medication list  -At that visit, you will be asked to sign a consent for a possible blood transfusion in case a transfusion becomes necessary during surgery.  The need for a blood transfusion is rare but having consent is a necessary part of your care.     -You should not be taking blood thinners or aspirin at least ten days prior to surgery unless instructed by your surgeon.  -Do not take supplements such as fish oil (omega 3), red yeast rice, turmeric before your surgery. You want to avoid medications with aspirin in them including headache powders such as BC or Goody's), Excedrin migraine.  Day Before Surgery at [place] will be asked to take in a light diet the day before surgery. You will be advised you can have clear liquids up until 3 hours before your surgery.    Eat a light diet the day before surgery.  Examples including soups, broths, toast, yogurt, mashed potatoes.  AVOID GAS PRODUCING  FOODS. Things to avoid include carbonated beverages (fizzy beverages, sodas), raw fruits and raw vegetables (uncooked), or beans.   If your bowels are filled with gas, your surgeon will have difficulty visualizing your pelvic organs which increases your surgical risks.  Your role in recovery Your role is to become active as soon as directed by your doctor, while still giving yourself time to heal.  Rest when you feel tired. You will be asked to do the following in order to speed your recovery:  - Cough and breathe deeply. This helps to clear and expand your lungs and can prevent pneumonia after surgery.  - [place]. Do mild physical activity. Walking or moving your legs help your circulation and body functions return to normal. Do not try to get up or walk alone the first time after surgery.   -If you develop swelling on one leg or the other, pain in the back of your leg, redness/warmth in one of your legs, please call the office or go to the Emergency Room to have a doppler to rule out a blood clot. For shortness of breath, chest pain-seek care in the Emergency Room as soon as possible. - Actively manage your pain. Managing your pain lets you move in comfort. We will ask you to rate your pain on a scale of zero to 10. It is your responsibility to tell your doctor or nurse where and how much you hurt so your pain can be treated.  Special Considerations -If you are diabetic, you may be placed on  insulin after surgery to have closer control over your blood sugars to promote healing and recovery.  This does not mean that you will be discharged on insulin.  If applicable, your oral antidiabetics will be resumed when you are tolerating a solid diet.  -Your final pathology results from surgery should be available around one week after surgery and the results will be relayed to you when available.  -FMLA forms can be faxed to [phone] and please allow 5-7 business days for  completion.  Pain Management After Surgery -You have been prescribed your pain medication and bowel regimen medications before surgery so that you can have these available when you are discharged from the hospital. The pain medication is for use ONLY AFTER surgery and a new prescription will not be given.   -Make sure that you have Tylenol and Ibuprofen IF YOU ARE ABLE TO TAKE THESE MEDICATIONS at home to use on a regular basis after surgery for pain control. We recommend alternating the medications every hour to six hours since they work differently and are processed in the body differently for pain relief.  -Review the attached handout on narcotic use and their risks and side effects.   Bowel Regimen -You have been prescribed Sennakot-S to take nightly to prevent constipation especially if you are taking the narcotic pain medication intermittently.  It is important to prevent constipation and drink adequate amounts of liquids. You can stop taking this medication when you are not taking pain medication and you are back on your normal bowel routine.  Risks of Surgery Risks of surgery are low but include bleeding, infection, damage to surrounding structures, re-operation, blood clots, and very rarely death.   Blood Transfusion Information (For the consent to be signed before surgery)  We will be checking your blood type before surgery so in case of emergencies, we will know what type of blood you would need.                                            WHAT IS A BLOOD TRANSFUSION?  A transfusion is the replacement of blood or some of its parts. Blood is made up of multiple cells which provide different functions. Red blood cells carry oxygen and are used for blood loss replacement. White blood cells fight against infection. Platelets control bleeding. Plasma helps clot blood. Other blood products are available for specialized needs, such as hemophilia or other clotting disorders. BEFORE THE  TRANSFUSION  Who gives blood for transfusions?  You may be able to donate blood to be used at a later date on yourself (autologous donation). Relatives can be asked to donate blood. This is generally not any safer than if you have received blood from a stranger. The same precautions are taken to ensure safety when a relative's blood is donated. Healthy volunteers who are fully evaluated to make sure their blood is safe. This is blood bank blood. Transfusion therapy is the safest it has ever been in the practice of medicine. Before blood is taken from a donor, a complete history is taken to make sure that person has no history of diseases nor engages in risky social behavior (examples are intravenous drug use or sexual activity with multiple partners). The donor's travel history is screened to minimize risk of transmitting infections, such as malaria. The donated blood is tested for signs of infectious diseases, such  as HIV and hepatitis. The blood is then tested to be sure it is compatible with you in order to minimize the chance of a transfusion reaction. If you or a relative donates blood, this is often done in anticipation of surgery and is not appropriate for emergency situations. It takes many days to process the donated blood. RISKS AND COMPLICATIONS Although transfusion therapy is very safe and saves many lives, the main dangers of transfusion include:  Getting an infectious disease. Developing a transfusion reaction. This is an allergic reaction to something in the blood you were given. Every precaution is taken to prevent this. The decision to have a blood transfusion has been considered carefully by your caregiver before blood is given. Blood is not given unless the benefits outweigh the risks.  AFTER SURGERY INSTRUCTIONS  Return to work: 4-6 weeks if applicable  Activity: 1. Be up and out of the bed during the day.  Take a nap if needed.  You may walk up steps but be careful and use the  hand rail.  Stair climbing will tire you more than you think, you may need to stop part way and rest.   2. No lifting or straining for 6 weeks over 10 pounds. No pushing, pulling, straining for 6 weeks.  3. No driving for around 1 week(s).  Do not drive if you are taking narcotic pain medicine and make sure that your reaction time has returned.   4. You can shower as soon as the next day after surgery. Shower daily.  Use your regular soap and water (not directly on the incision) and pat your incision(s) dry afterwards; don't rub.  No tub baths or submerging your body in water until cleared by your surgeon. If you have the soap that was given to you by pre-surgical testing that was used before surgery, you do not need to use it afterwards because this can irritate your incisions.   5. No sexual activity and nothing in the vagina for 4 weeks, 8 weeks if you have a hysterectomy.  6. You may experience a small amount of clear drainage from your incisions, which is normal.  If the drainage persists, increases, or changes color please call the office.  7. Do not use creams, lotions, or ointments such as neosporin on your incisions after surgery until advised by your surgeon because they can cause removal of the dermabond glue on your incisions.    8. You may experience vaginal spotting after surgery or around the 6-8 week mark from surgery when the stitches at the top of the vagina begin to dissolve (if you have a hysterectomy)   .  The spotting is normal but if you experience heavy bleeding, call our office.  9. Take Tylenol or ibuprofen first for pain if you are able to take these medications and only use narcotic pain medication for severe pain not relieved by the Tylenol or Ibuprofen.  Monitor your Tylenol intake to a max of 4,000 mg in a 24 hour period. You can alternate these medications after surgery.  Diet: 1. Low sodium Heart Healthy Diet is recommended but you are cleared to resume your normal  (before surgery) diet after your procedure.  2. It is safe to use a laxative, such as Miralax or Colace, if you have difficulty moving your bowels. You have been prescribed Sennakot-S to take at bedtime every evening after surgery to keep bowel movements regular and to prevent constipation.    Wound Care: 1. Keep clean  and dry.  Shower daily.  Reasons to call the Doctor: Fever - Oral temperature greater than 100.4 degrees Fahrenheit Foul-smelling vaginal discharge Difficulty urinating Nausea and vomiting Increased pain at the site of the incision that is unrelieved with pain medicine. Difficulty breathing with or without chest pain New calf pain especially if only on one side Sudden, continuing increased vaginal bleeding with or without clots.   Contacts: For questions or concerns you should contact:  Dr. Jeral Pinch at [phone]  [name], NP at [phone]  After Hours: call [phone] and have the GYN Oncologist paged/contacted (after 5 pm or on the weekends).  Messages sent via mychart are for non-urgent matters and are not responded to after hours so for urgent needs, please call the after hours number.

## 2021-12-22 NOTE — Telephone Encounter (Signed)
Message from oncology nurse that patient was seen this morning and has MRI scheduled for 12/24/21.

## 2021-12-24 ENCOUNTER — Ambulatory Visit (HOSPITAL_COMMUNITY)
Admission: RE | Admit: 2021-12-24 | Discharge: 2021-12-24 | Disposition: A | Discharge status: Home / Self Care | Payer: BLUE CROSS/BLUE SHIELD | Source: Ambulatory Visit | Attending: Gynecologic Oncology | Admitting: Gynecologic Oncology

## 2021-12-24 DIAGNOSIS — N9489 Other specified conditions associated with female genital organs and menstrual cycle: Secondary | ICD-10-CM | POA: Diagnosis not present

## 2021-12-24 MED ORDER — GADOBUTROL 1 MMOL/ML IV SOLN
6.0000 mL | Freq: Once | INTRAVENOUS | Status: AC | PRN
  Administered 2021-12-24: 6 mL via INTRAVENOUS

## 2021-12-27 NOTE — Patient Instructions (Signed)
DUE TO COVID-19 ONLY TWO VISITORS  (aged 55 and older)  ARE ALLOWED TO COME WITH YOU AND STAY IN THE WAITING ROOM ONLY DURING PRE OP AND PROCEDURE.   **NO VISITORS ARE ALLOWED IN THE SHORT STAY AREA OR RECOVERY ROOM!!**  IF YOU WILL BE ADMITTED INTO THE HOSPITAL YOU ARE ALLOWED ONLY FOUR SUPPORT PEOPLE DURING VISITATION HOURS ONLY (7 AM -8PM)   The support person(s) must pass our screening, gel in and out, and wear a mask at all times, including in the patient's room. Patients must also wear a mask when staff or their support person are in the room. Visitors GUEST BADGE MUST BE WORN VISIBLY  One adult visitor may remain with you overnight and MUST be in the room by 8 P.M.     Your procedure is scheduled on: 01/04/22   Report to [facility] Main Entrance    Report to admitting at ; 9:45 AM   Call this number if you have problems the morning of surgery [phone]   Do not eat food :After 6:00 pm THE DAY BEFORE SURGERY.   After 6:00 pm you may have the following liquids until : 9:00 AM DAY OF SURGERY  Water Black Coffee (sugar ok, NO MILK/CREAM OR CREAMERS)  Tea (sugar ok, NO MILK/CREAM OR CREAMERS) regular and decaf                             Plain Jell-O (NO RED)                                           Fruit ices (not with fruit pulp, NO RED)                                     Popsicles (NO RED)                                                                  Juice: apple, WHITE grape, WHITE cranberry Sports drinks like Gatorade (NO RED)              MORNING OF SURGERY DRINK:   DRINK 1 G2 drink BEFORE YOU LEAVE HOME, DRINK ALL OF THE  G2 DRINK AT ONE TIME.   NO SOLID FOOD AFTER 600 PM THE NIGHT BEFORE YOUR SURGERY. YOU MAY DRINK CLEAR FLUIDS. THE G2 DRINK YOU DRINK BEFORE YOU LEAVE HOME WILL BE THE LAST FLUIDS YOU DRINK BEFORE SURGERY.  PAIN IS EXPECTED AFTER SURGERY AND WILL NOT BE COMPLETELY ELIMINATED. AMBULATION AND TYLENOL WILL HELP REDUCE INCISIONAL AND GAS PAIN.  MOVEMENT IS KEY!  YOU ARE EXPECTED TO BE OUT OF BED WITHIN 4 HOURS OF ADMISSION TO YOUR PATIENT ROOM.  SITTING IN THE RECLINER THROUGHOUT THE DAY IS IMPORTANT FOR DRINKING FLUIDS AND MOVING GAS THROUGHOUT THE GI TRACT.  COMPRESSION STOCKINGS SHOULD BE WORN [place] UNLESS YOU ARE WALKING.   INCENTIVE SPIROMETER SHOULD BE USED EVERY HOUR WHILE AWAKE TO DECREASE POST-OPERATIVE COMPLICATIONS SUCH AS PNEUMONIA.  [place], IT  IS IMPORTANT TO CONTINUE TO WALK EVERY HOUR AND USE THE INCENTIVE SPIROMETER EVERY HOUR.      The day of surgery:  Drink ONE (1) Pre-Surgery Clear Ensure or G2 at AM the morning of surgery. Drink in one sitting. Do not sip.  This drink was given to you during your hospital  pre-op appointment visit. Nothing else to drink after completing the  Pre-Surgery Clear Ensure or G2.          If you have questions, please contact your surgeon's office.    Oral Hygiene is also important to reduce your risk of infection.                                    Remember - BRUSH YOUR TEETH THE MORNING OF SURGERY WITH YOUR REGULAR TOOTHPASTE   Do NOT smoke after Midnight   Take these medicines the morning of surgery with A SIP OF WATER: n/a  DO NOT TAKE ANY ORAL DIABETIC MEDICATIONS DAY OF YOUR SURGERY  Bring CPAP mask and tubing day of surgery.                              You may not have any metal on your body including hair pins, jewelry, and body piercing             Do not wear make-up, lotions, powders, perfumes/cologne, or deodorant  Do not wear nail polish including gel and S&S, artificial/acrylic nails, or any other type of covering on natural nails including finger and toenails. If you have artificial nails, gel coating, etc. that needs to be removed by a nail salon please have this removed prior to surgery or surgery may need to be canceled/ delayed if the surgeon/ anesthesia feels like they are unable to be safely monitored.   Do not  shave  48 hours prior to surgery.   Do not bring valuables to the hospital. [place].   Contacts, dentures or bridgework may not be worn into surgery.   Bring small overnight bag day of surgery.   DO NOT [place]. PHARMACY WILL DISPENSE MEDICATIONS LISTED ON YOUR MEDICATION LIST TO YOU DURING YOUR ADMISSION [place]!    Patients discharged on the day of surgery will not be allowed to drive home.  Someone NEEDS to stay with you for the first 24 hours after anesthesia.   Special Instructions: Bring a copy of your healthcare power of attorney and living will documents         the day of surgery if you haven't scanned them before.              Please read over the following fact sheets you were given: IF YOU HAVE QUESTIONS ABOUT YOUR PRE-OP INSTRUCTIONS PLEASE CALL [phone]     [facility] - Preparing for Surgery Before surgery, you can play an important role.  Because skin is not sterile, your skin needs to be as free of germs as possible.  You can reduce the number of germs on your skin by washing with CHG (chlorahexidine gluconate) soap before surgery.  CHG is an antiseptic cleaner which kills germs and bonds with the skin to continue killing germs even after washing. Please  DO NOT use if you have an allergy to CHG or antibacterial soaps.  If your skin becomes reddened/irritated stop using the CHG and inform your nurse when you arrive at Short Stay. Do not shave (including legs and underarms) for at least 48 hours prior to the first CHG shower.  You may shave your face/neck. Please follow these instructions carefully:  1.  Shower with CHG Soap the night before surgery and the  morning of Surgery.  2.  If you choose to wash your hair, wash your hair first as usual with your  normal  shampoo.  3.  After you shampoo, rinse your hair and body thoroughly to remove the  shampoo.                            4.  Use CHG as you would any other liquid soap.  You can apply chg directly  to the skin and wash                       Gently with a scrungie or clean washcloth.  5.  Apply the CHG Soap to your body ONLY FROM THE NECK DOWN.   Do not use on face/ open                           Wound or open sores. Avoid contact with eyes, ears mouth and genitals (private parts).                       Wash face,  Genitals (private parts) with your normal soap.             6.  Wash thoroughly, paying special attention to the area where your surgery  will be performed.  7.  Thoroughly rinse your body with warm water from the neck down.  8.  DO NOT shower/wash with your normal soap after using and rinsing off  the CHG Soap.                9.  Pat yourself dry with a clean towel.            10.  Wear clean pajamas.            11.  Place clean sheets on your bed the night of your first shower and do not  sleep with pets. Day of Surgery : Do not apply any lotions/deodorants the morning of surgery.  Please wear clean clothes to the hospital/surgery center.  FAILURE TO FOLLOW THESE INSTRUCTIONS MAY RESULT IN THE CANCELLATION OF YOUR SURGERY PATIENT SIGNATURE_________________________________  NURSE SIGNATURE__________________________________  ________________________________________________________________________

## 2021-12-28 ENCOUNTER — Telehealth: Payer: Self-pay | Admitting: Gynecologic Oncology

## 2021-12-28 ENCOUNTER — Other Ambulatory Visit: Payer: Self-pay

## 2021-12-28 ENCOUNTER — Encounter (HOSPITAL_COMMUNITY)
Admission: RE | Admit: 2021-12-28 | Discharge: 2021-12-28 | Disposition: A | Discharge status: Home / Self Care | Payer: BLUE CROSS/BLUE SHIELD | Source: Ambulatory Visit | Attending: Gynecologic Oncology | Admitting: Gynecologic Oncology

## 2021-12-28 ENCOUNTER — Telehealth: Payer: Self-pay

## 2021-12-28 ENCOUNTER — Encounter (HOSPITAL_COMMUNITY): Payer: Self-pay

## 2021-12-28 DIAGNOSIS — Z01812 Encounter for preprocedural laboratory examination: Secondary | ICD-10-CM | POA: Insufficient documentation

## 2021-12-28 DIAGNOSIS — N9489 Other specified conditions associated with female genital organs and menstrual cycle: Secondary | ICD-10-CM | POA: Insufficient documentation

## 2021-12-28 LAB — CBC
HCT: 41.2 % (ref 36.0–46.0)
Hemoglobin: 13.3 g/dL (ref 12.0–15.0)
MCH: 31.3 pg (ref 26.0–34.0)
MCHC: 32.3 g/dL (ref 30.0–36.0)
MCV: 96.9 fL (ref 80.0–100.0)
Platelets: 249 10*3/uL (ref 150–400)
RBC: 4.25 MIL/uL (ref 3.87–5.11)
RDW: 13 % (ref 11.5–15.5)
WBC: 3.9 10*3/uL — ABNORMAL LOW (ref 4.0–10.5)
nRBC: 0 % (ref 0.0–0.2)

## 2021-12-28 LAB — COMPREHENSIVE METABOLIC PANEL
ALT: 17 U/L (ref 0–44)
AST: 19 U/L (ref 15–41)
Albumin: 4.3 g/dL (ref 3.5–5.0)
Alkaline Phosphatase: 44 U/L (ref 38–126)
Anion gap: 6 (ref 5–15)
BUN: 11 mg/dL (ref 6–20)
CO2: 27 mmol/L (ref 22–32)
Calcium: 9.8 mg/dL (ref 8.9–10.3)
Chloride: 109 mmol/L (ref 98–111)
Creatinine, Ser: 0.65 mg/dL (ref 0.44–1.00)
GFR, Estimated: >60 mL/min (ref >60)
Glucose, Bld: 89 mg/dL (ref 70–99)
Potassium: 4.1 mmol/L (ref 3.5–5.1)
Sodium: 142 mmol/L (ref 135–145)
Total Bilirubin: 0.8 mg/dL (ref 0.3–1.2)
Total Protein: 7.2 g/dL (ref 6.5–8.1)

## 2021-12-28 NOTE — Telephone Encounter (Signed)
Pt called this morning stating Mychart shows the MRI results are back. On Friday, when she saw Dr.Tucker, it was discussed that surgery depends on the MRI results. Also, she states she had labs this morning and they mentioned something about needing supplements. She wasn't quite sure what that meant.  She would like for Berline Lopes to call her with MRI results. Surgery is scheduled for 9/13.   PT aware Dr. Berline Lopes is in the OR today but I will send her this message.

## 2021-12-28 NOTE — Progress Notes (Signed)
For Short Stay: [place] appointment date: Date of COVID positive in last 47 days:  Bowel Prep reminder:   For Anesthesia: PCP - DO: Howard Pouch. Cardiologist -   Chest x-ray -  EKG -  Stress Test -  ECHO -  Cardiac Cath -  Pacemaker/ICD device last checked: Pacemaker orders received: Device Rep notified:  Spinal Cord Stimulator:  Sleep Study -  CPAP -   Fasting Blood Sugar -  Checks Blood Sugar _____ times a day Date and result of last Hgb A1c- 5.5: 11/10/21  Blood Thinner Instructions: Aspirin Instructions: Last Dose:  Activity level: Can go up a flight of stairs and activities of daily living without stopping and without chest pain and/or shortness of breath   Able to exercise without chest pain and/or shortness of breath   Unable to go up a flight of stairs without chest pain and/or shortness of breath     Anesthesia review:   Patient denies shortness of breath, fever, cough and chest pain at PAT appointment   Patient verbalized understanding of instructions that were given to them at the PAT appointment. Patient was also instructed that they will need to review over the PAT instructions again at home before surgery.

## 2021-12-28 NOTE — Telephone Encounter (Signed)
Returned patient's call, discussed MRI findings. Answered all questions. Overall, MRI results reassuring. Discussed risks and benefits of surgery now (as scheduled) versus continued surveillance. Patient would like to move forward with surgery on 13th. Discussed staging if borderline or malignancy found on frozen section.   Jeral Pinch MD Gynecologic Oncology

## 2021-12-30 ENCOUNTER — Inpatient Hospital Stay: Payer: BLUE CROSS/BLUE SHIELD | Attending: Gynecologic Oncology | Admitting: Gynecologic Oncology

## 2021-12-30 DIAGNOSIS — N9489 Other specified conditions associated with female genital organs and menstrual cycle: Secondary | ICD-10-CM

## 2021-12-30 MED ORDER — IBUPROFEN 800 MG PO TABS: 800.0000 mg | ORAL_TABLET | Freq: Three times a day (TID) | ORAL | 0 refills | Status: DC | PRN

## 2021-12-30 MED ORDER — TRAMADOL HCL 50 MG PO TABS: 50.0000 mg | ORAL_TABLET | Freq: Four times a day (QID) | ORAL | 0 refills | Status: DC | PRN

## 2021-12-30 NOTE — Progress Notes (Signed)
Gynecology Oncology Pre-operative Telephone Visit  Jacqueli Pangallo is a 56 year old female scheduled for surgery on January 04, 2022 with Dr. Jeral Pinch at [facility]. She is scheduled for robotic assisted laparoscopic bilateral salpingo-oophorectomy, possible staging if a pre-cancer or cancer is identified including total laparoscopic hysterectomy, possible laparotomy.   Today we had a preoperative call to discuss instructions given in written instructions from visit on 12/22/2021. Patient has had a chance to review the instructions prior to this call as well. All pre-operative and post-operative instructions reviewed. All questions answered. Post-op medications including tramadol and ibuprofen have been sent in pre-operatively. She will plan to use flax seed oil and apple cider vinegar for her bowel regimen and knows to contact the office if bowels are not moving post-operatively.   She is aware our office will contact her the day before her surgery to check in. She is advised to call the office for any needs, concerns, or questions at any time.

## 2022-01-02 ENCOUNTER — Telehealth: Payer: Self-pay

## 2022-01-02 NOTE — Telephone Encounter (Signed)
Pt left a voicemail on the after hours triage line this morning at 7:15 stating she is supposed to have a "replacement crown" this morning at 8:00. She is scheduled to have surgery with Dr.Tucker on Wednesday 9/13 and was wanting to know if it was ok to have dental work.   [name] NP notified and she stated it should be fine to have a replacement crown done today.  [place] aware and was thankful for the call.

## 2022-01-03 ENCOUNTER — Telehealth: Payer: Self-pay

## 2022-01-03 NOTE — Telephone Encounter (Signed)
Telephone call to check on pre-operative status.  Patient compliant with pre-operative instructions.  Reinforced nothing to eat after midnight. Clear liquids until 9:00am. Patient to arrive at 9:45am.  No questions or concerns voiced.  Instructed to call for any needs.

## 2022-01-04 ENCOUNTER — Ambulatory Visit (HOSPITAL_COMMUNITY): Payer: BLUE CROSS/BLUE SHIELD | Admitting: Certified Registered"

## 2022-01-04 ENCOUNTER — Ambulatory Visit (HOSPITAL_COMMUNITY)
Admission: RE | Admit: 2022-01-04 | Discharge: 2022-01-04 | Disposition: A | Discharge status: Home / Self Care | Payer: BLUE CROSS/BLUE SHIELD | Source: Ambulatory Visit | Attending: Gynecologic Oncology | Admitting: Gynecologic Oncology

## 2022-01-04 ENCOUNTER — Encounter (HOSPITAL_COMMUNITY)
Admission: RE | Disposition: A | Discharge status: Home / Self Care | Payer: Self-pay | Source: Ambulatory Visit | Attending: Gynecologic Oncology

## 2022-01-04 ENCOUNTER — Other Ambulatory Visit: Payer: Self-pay

## 2022-01-04 ENCOUNTER — Encounter (HOSPITAL_COMMUNITY): Payer: Self-pay | Admitting: Gynecologic Oncology

## 2022-01-04 DIAGNOSIS — N83291 Other ovarian cyst, right side: Secondary | ICD-10-CM | POA: Diagnosis not present

## 2022-01-04 DIAGNOSIS — D251 Intramural leiomyoma of uterus: Secondary | ICD-10-CM | POA: Diagnosis not present

## 2022-01-04 DIAGNOSIS — D270 Benign neoplasm of right ovary: Secondary | ICD-10-CM | POA: Insufficient documentation

## 2022-01-04 DIAGNOSIS — Z780 Asymptomatic menopausal state: Secondary | ICD-10-CM | POA: Insufficient documentation

## 2022-01-04 DIAGNOSIS — N9489 Other specified conditions associated with female genital organs and menstrual cycle: Secondary | ICD-10-CM

## 2022-01-04 DIAGNOSIS — Z8041 Family history of malignant neoplasm of ovary: Secondary | ICD-10-CM | POA: Insufficient documentation

## 2022-01-04 DIAGNOSIS — D250 Submucous leiomyoma of uterus: Secondary | ICD-10-CM | POA: Diagnosis not present

## 2022-01-04 HISTORY — DX: Other specified conditions associated with female genital organs and menstrual cycle: N94.89

## 2022-01-04 HISTORY — PX: ROBOTIC ASSISTED BILATERAL SALPINGO OOPHERECTOMY: SHX6078

## 2022-01-04 LAB — TYPE AND SCREEN
ABO/RH(D): A NEG
Antibody Screen: NEGATIVE

## 2022-01-04 LAB — ABO/RH: ABO/RH(D): A NEG

## 2022-01-04 SURGERY — SALPINGO-OOPHORECTOMY, BILATERAL, ROBOT-ASSISTED
Anesthesia: General | Laterality: Bilateral

## 2022-01-04 MED ORDER — ROCURONIUM BROMIDE 10 MG/ML (PF) SYRINGE
PREFILLED_SYRINGE | INTRAVENOUS | Status: DC | PRN
  Administered 2022-01-04: 50 mg via INTRAVENOUS

## 2022-01-04 MED ORDER — ONDANSETRON HCL 4 MG/2ML IJ SOLN
INTRAMUSCULAR | Status: AC
  Filled 2022-01-04: qty 2

## 2022-01-04 MED ORDER — ONDANSETRON HCL 4 MG/2ML IJ SOLN
INTRAMUSCULAR | Status: DC | PRN
  Administered 2022-01-04: 4 mg via INTRAVENOUS

## 2022-01-04 MED ORDER — PROPOFOL 10 MG/ML IV BOLUS
INTRAVENOUS | Status: DC | PRN
  Administered 2022-01-04: 140 mg via INTRAVENOUS

## 2022-01-04 MED ORDER — KETAMINE HCL 50 MG/5ML IJ SOSY
PREFILLED_SYRINGE | INTRAMUSCULAR | Status: AC
  Filled 2022-01-04: qty 5

## 2022-01-04 MED ORDER — KETAMINE HCL 10 MG/ML IJ SOLN
INTRAMUSCULAR | Status: DC | PRN
  Administered 2022-01-04 (×3): 10 mg via INTRAVENOUS
  Administered 2022-01-04: 5 mg via INTRAVENOUS
  Administered 2022-01-04: 15 mg via INTRAVENOUS

## 2022-01-04 MED ORDER — SUGAMMADEX SODIUM 200 MG/2ML IV SOLN
INTRAVENOUS | Status: DC | PRN
  Administered 2022-01-04: 150 mg via INTRAVENOUS

## 2022-01-04 MED ORDER — DEXAMETHASONE SODIUM PHOSPHATE 10 MG/ML IJ SOLN
INTRAMUSCULAR | Status: AC
  Filled 2022-01-04: qty 1

## 2022-01-04 MED ORDER — FENTANYL CITRATE PF 50 MCG/ML IJ SOSY
25.0000 ug | PREFILLED_SYRINGE | INTRAMUSCULAR | Status: DC | PRN
  Administered 2022-01-04: 50 ug via INTRAVENOUS

## 2022-01-04 MED ORDER — KETOROLAC TROMETHAMINE 30 MG/ML IJ SOLN
INTRAMUSCULAR | Status: AC
  Filled 2022-01-04: qty 1

## 2022-01-04 MED ORDER — FENTANYL CITRATE (PF) 100 MCG/2ML IJ SOLN
INTRAMUSCULAR | Status: DC | PRN
  Administered 2022-01-04: 100 ug via INTRAVENOUS

## 2022-01-04 MED ORDER — PROPOFOL 10 MG/ML IV BOLUS
INTRAVENOUS | Status: AC
  Filled 2022-01-04: qty 20

## 2022-01-04 MED ORDER — GABAPENTIN 300 MG PO CAPS
300.0000 mg | ORAL_CAPSULE | ORAL | Status: AC
  Administered 2022-01-04: 300 mg via ORAL
  Filled 2022-01-04: qty 1

## 2022-01-04 MED ORDER — KETOROLAC TROMETHAMINE 30 MG/ML IJ SOLN
INTRAMUSCULAR | Status: DC | PRN
  Administered 2022-01-04: 30 mg via INTRAVENOUS

## 2022-01-04 MED ORDER — MIDAZOLAM HCL 5 MG/5ML IJ SOLN
INTRAMUSCULAR | Status: DC | PRN
  Administered 2022-01-04: 2 mg via INTRAVENOUS

## 2022-01-04 MED ORDER — CELECOXIB 200 MG PO CAPS
200.0000 mg | ORAL_CAPSULE | ORAL | Status: AC
  Administered 2022-01-04: 200 mg via ORAL
  Filled 2022-01-04: qty 1

## 2022-01-04 MED ORDER — BUPIVACAINE HCL 0.25 % IJ SOLN
INTRAMUSCULAR | Status: DC | PRN
  Administered 2022-01-04: 20 mL

## 2022-01-04 MED ORDER — LIDOCAINE 2% (20 MG/ML) 5 ML SYRINGE
INTRAMUSCULAR | Status: DC | PRN
  Administered 2022-01-04: 60 mg via INTRAVENOUS

## 2022-01-04 MED ORDER — SCOPOLAMINE 1 MG/3DAYS TD PT72
1.0000 | MEDICATED_PATCH | TRANSDERMAL | Status: DC
  Administered 2022-01-04: 1.5 mg via TRANSDERMAL
  Filled 2022-01-04: qty 1

## 2022-01-04 MED ORDER — STERILE WATER FOR IRRIGATION IR SOLN
Status: DC | PRN
  Administered 2022-01-04: 1000 mL

## 2022-01-04 MED ORDER — ROCURONIUM BROMIDE 10 MG/ML (PF) SYRINGE
PREFILLED_SYRINGE | INTRAVENOUS | Status: AC
  Filled 2022-01-04: qty 10

## 2022-01-04 MED ORDER — HEPARIN SODIUM (PORCINE) 5000 UNIT/ML IJ SOLN
5000.0000 [IU] | INTRAMUSCULAR | Status: AC
  Administered 2022-01-04: 5000 [IU] via SUBCUTANEOUS
  Filled 2022-01-04: qty 1

## 2022-01-04 MED ORDER — FENTANYL CITRATE PF 50 MCG/ML IJ SOSY
PREFILLED_SYRINGE | INTRAMUSCULAR | Status: AC
  Filled 2022-01-04: qty 1

## 2022-01-04 MED ORDER — BUPIVACAINE HCL 0.25 % IJ SOLN
INTRAMUSCULAR | Status: AC
  Filled 2022-01-04: qty 1

## 2022-01-04 MED ORDER — MIDAZOLAM HCL 2 MG/2ML IJ SOLN
INTRAMUSCULAR | Status: AC
  Filled 2022-01-04: qty 2

## 2022-01-04 MED ORDER — LACTATED RINGERS IR SOLN
Status: DC | PRN
  Administered 2022-01-04: 1000 mL

## 2022-01-04 MED ORDER — ORAL CARE MOUTH RINSE
15.0000 mL | Freq: Once | OROMUCOSAL | Status: AC
Start: 2022-01-04 — End: 2022-01-04

## 2022-01-04 MED ORDER — CHLORHEXIDINE GLUCONATE 0.12 % MT SOLN
15.0000 mL | Freq: Once | OROMUCOSAL | Status: AC
Start: 2022-01-04 — End: 2022-01-04
  Administered 2022-01-04: 15 mL via OROMUCOSAL

## 2022-01-04 MED ORDER — LIDOCAINE 20MG/ML (2%) 15 ML SYRINGE OPTIME
INTRAMUSCULAR | Status: DC | PRN
  Administered 2022-01-04: 1.5 mg/kg/h via INTRAVENOUS

## 2022-01-04 MED ORDER — LACTATED RINGERS IV SOLN: INTRAVENOUS | Status: DC

## 2022-01-04 MED ORDER — ACETAMINOPHEN 500 MG PO TABS
1000.0000 mg | ORAL_TABLET | ORAL | Status: AC
  Administered 2022-01-04: 1000 mg via ORAL
  Filled 2022-01-04: qty 2

## 2022-01-04 MED ORDER — DEXAMETHASONE SODIUM PHOSPHATE 4 MG/ML IJ SOLN
4.0000 mg | INTRAMUSCULAR | Status: AC
  Administered 2022-01-04: 10 mg via INTRAVENOUS

## 2022-01-04 MED ORDER — FENTANYL CITRATE (PF) 100 MCG/2ML IJ SOLN
INTRAMUSCULAR | Status: AC
  Filled 2022-01-04: qty 2

## 2022-01-04 SURGICAL SUPPLY — 70 items
APPLICATOR SURGIFLO ENDO (HEMOSTASIS) IMPLANT
BAG LAPAROSCOPIC 12 15 PORT 16 (BASKET) IMPLANT
BAG RETRIEVAL 12/15 (BASKET)
BLADE SURG SZ10 CARB STEEL (BLADE) IMPLANT
COVER BACK TABLE 60X90IN (DRAPES) ×2 IMPLANT
COVER TIP SHEARS 8 DVNC (MISCELLANEOUS) ×2 IMPLANT
COVER TIP SHEARS 8MM DA VINCI (MISCELLANEOUS) ×2
DERMABOND ADVANCED .7 DNX12 (GAUZE/BANDAGES/DRESSINGS) ×2 IMPLANT
DRAPE ARM DVNC X/XI (DISPOSABLE) ×8 IMPLANT
DRAPE COLUMN DVNC XI (DISPOSABLE) ×2 IMPLANT
DRAPE DA VINCI XI ARM (DISPOSABLE) ×8
DRAPE DA VINCI XI COLUMN (DISPOSABLE) ×2
DRAPE SHEET LG 3/4 BI-LAMINATE (DRAPES) ×2 IMPLANT
DRAPE SURG IRRIG POUCH 19X23 (DRAPES) ×2 IMPLANT
DRSG OPSITE POSTOP 4X6 (GAUZE/BANDAGES/DRESSINGS) IMPLANT
DRSG OPSITE POSTOP 4X8 (GAUZE/BANDAGES/DRESSINGS) IMPLANT
ELECT PENCIL ROCKER SW 15FT (MISCELLANEOUS) IMPLANT
ELECT REM PT RETURN 15FT ADLT (MISCELLANEOUS) ×2 IMPLANT
GAUZE 4X4 16PLY ~~LOC~~+RFID DBL (SPONGE) ×4 IMPLANT
GLOVE BIO SURGEON STRL SZ 6 (GLOVE) ×8 IMPLANT
GLOVE BIO SURGEON STRL SZ 6.5 (GLOVE) ×2 IMPLANT
GOWN STRL REUS W/ TWL LRG LVL3 (GOWN DISPOSABLE) ×8 IMPLANT
GOWN STRL REUS W/TWL LRG LVL3 (GOWN DISPOSABLE) ×8
GRASPER SUT TROCAR 14GX15 (MISCELLANEOUS) ×1 IMPLANT
HOLDER FOLEY CATH W/STRAP (MISCELLANEOUS) IMPLANT
IRRIG SUCT STRYKERFLOW 2 WTIP (MISCELLANEOUS) ×2
IRRIGATION SUCT STRKRFLW 2 WTP (MISCELLANEOUS) ×2 IMPLANT
KIT PROCEDURE DA VINCI SI (MISCELLANEOUS)
KIT PROCEDURE DVNC SI (MISCELLANEOUS) IMPLANT
KIT TURNOVER KIT A (KITS) IMPLANT
LIGASURE IMPACT 36 18CM CVD LR (INSTRUMENTS) IMPLANT
MANIPULATOR ADVINCU DEL 3.0 PL (MISCELLANEOUS) IMPLANT
MANIPULATOR ADVINCU DEL 3.5 PL (MISCELLANEOUS) IMPLANT
MANIPULATOR UTERINE 4.5 ZUMI (MISCELLANEOUS) IMPLANT
NDL HYPO 21X1.5 SAFETY (NEEDLE) ×1 IMPLANT
NDL SPNL 18GX3.5 QUINCKE PK (NEEDLE) IMPLANT
NEEDLE HYPO 21X1.5 SAFETY (NEEDLE) ×2 IMPLANT
NEEDLE SPNL 18GX3.5 QUINCKE PK (NEEDLE) IMPLANT
OBTURATOR OPTICAL STANDARD 8MM (TROCAR) ×2
OBTURATOR OPTICAL STND 8 DVNC (TROCAR) ×2
OBTURATOR OPTICALSTD 8 DVNC (TROCAR) ×2 IMPLANT
PACK ROBOT GYN CUSTOM WL (TRAY / TRAY PROCEDURE) ×2 IMPLANT
PAD POSITIONING PINK XL (MISCELLANEOUS) ×2 IMPLANT
PORT ACCESS TROCAR AIRSEAL 12 (TROCAR) ×2 IMPLANT
PORT ACCESS TROCAR AIRSEAL 5M (TROCAR) ×2
SCRUB CHG 4% DYNA-HEX 4OZ (MISCELLANEOUS) ×4 IMPLANT
SEAL CANN UNIV 5-8 DVNC XI (MISCELLANEOUS) ×8 IMPLANT
SEAL XI 5MM-8MM UNIVERSAL (MISCELLANEOUS) ×8
SET TRI-LUMEN FLTR TB AIRSEAL (TUBING) ×2 IMPLANT
SPIKE FLUID TRANSFER (MISCELLANEOUS) ×2 IMPLANT
SPONGE T-LAP 18X18 ~~LOC~~+RFID (SPONGE) IMPLANT
SURGIFLO W/THROMBIN 8M KIT (HEMOSTASIS) IMPLANT
SUT MNCRL AB 4-0 PS2 18 (SUTURE) IMPLANT
SUT PDS AB 1 TP1 96 (SUTURE) IMPLANT
SUT VIC AB 0 CT1 27 (SUTURE)
SUT VIC AB 0 CT1 27XBRD ANTBC (SUTURE) IMPLANT
SUT VIC AB 2-0 CT1 27 (SUTURE)
SUT VIC AB 2-0 CT1 TAPERPNT 27 (SUTURE) IMPLANT
SUT VIC AB 4-0 PS2 18 (SUTURE) ×4 IMPLANT
SYR 10ML LL (SYRINGE) IMPLANT
SYS BAG RETRIEVAL 10MM (BASKET) ×4
SYS WOUND ALEXIS 18CM MED (MISCELLANEOUS)
SYSTEM BAG RETRIEVAL 10MM (BASKET) ×2 IMPLANT
SYSTEM WOUND ALEXIS 18CM MED (MISCELLANEOUS) IMPLANT
TOWEL OR NON WOVEN STRL DISP B (DISPOSABLE) IMPLANT
TRAP SPECIMEN MUCUS 40CC (MISCELLANEOUS) IMPLANT
TRAY FOLEY MTR SLVR 16FR STAT (SET/KITS/TRAYS/PACK) ×2 IMPLANT
UNDERPAD 30X36 HEAVY ABSORB (UNDERPADS AND DIAPERS) ×4 IMPLANT
WATER STERILE IRR 1000ML POUR (IV SOLUTION) ×1 IMPLANT
YANKAUER SUCT BULB TIP 10FT TU (MISCELLANEOUS) IMPLANT

## 2022-01-04 NOTE — Discharge Instructions (Addendum)
AFTER SURGERY INSTRUCTIONS   Return to work: 4-6 weeks if applicable   Activity: 1. Be up and out of the bed during the day.  Take a nap if needed.  You may walk up steps but be careful and use the hand rail.  Stair climbing will tire you more than you think, you may need to stop part way and rest.    2. No lifting or straining for 6 weeks over 10 pounds. No pushing, pulling, straining for 6 weeks.   3. No driving for around 1 week(s).  Do not drive if you are taking narcotic pain medicine and make sure that your reaction time has returned.    4. You can shower as soon as the next day after surgery. Shower daily.  Use your regular soap and water (not directly on the incision) and pat your incision(s) dry afterwards; don't rub.  No tub baths or submerging your body in water until cleared by your surgeon. If you have the soap that was given to you by pre-surgical testing that was used before surgery, you do not need to use it afterwards because this can irritate your incisions.    5. No sexual activity and nothing in the vagina for 4 weeks.   6. You may experience a small amount of clear drainage from your incisions, which is normal.  If the drainage persists, increases, or changes color please call the office.   7. Do not use creams, lotions, or ointments such as neosporin on your incisions after surgery until advised by your surgeon because they can cause removal of the dermabond glue on your incisions.     8. You may experience vaginal spotting after surgery.  The spotting is normal but if you experience heavy bleeding, call our office.   9. Take Tylenol or ibuprofen first for pain if you are able to take these medications and only use narcotic pain medication for severe pain not relieved by the Tylenol or Ibuprofen.  Monitor your Tylenol intake to a max of 4,000 mg in a 24 hour period. You can alternate these medications after surgery.   Diet: 1. Low sodium Heart Healthy Diet is recommended  but you are cleared to resume your normal (before surgery) diet after your procedure.   2. It is safe to use a laxative, such as Miralax or Colace, if you have difficulty moving your bowels. You have been prescribed Sennakot-S to take at bedtime every evening after surgery to keep bowel movements regular and to prevent constipation.     Wound Care: 1. Keep clean and dry.  Shower daily.   Reasons to call the Doctor: Fever - Oral temperature greater than 100.4 degrees Fahrenheit Foul-smelling vaginal discharge Difficulty urinating Nausea and vomiting Increased pain at the site of the incision that is unrelieved with pain medicine. Difficulty breathing with or without chest pain New calf pain especially if only on one side Sudden, continuing increased vaginal bleeding with or without clots.   Contacts: For questions or concerns you should contact:   Dr. Jeral Pinch at [phone]   [name], NP at [phone]   After Hours: call [phone] and have the GYN Oncologist paged/contacted (after 5 pm or on the weekends).   Messages sent via mychart are for non-urgent matters and are not responded to after hours so for urgent needs, please call the after hours number.

## 2022-01-04 NOTE — Anesthesia Procedure Notes (Signed)
Procedure Name: Intubation Date/Time: 01/04/2022 1:01 PM  Performed by: Frederic Tones D, CRNAPre-anesthesia Checklist: Patient identified, Emergency Drugs available, Suction available and Patient being monitored Patient Re-evaluated:Patient Re-evaluated prior to induction Oxygen Delivery Method: Circle system utilized Preoxygenation: Pre-oxygenation with 100% oxygen Induction Type: IV induction Ventilation: Mask ventilation without difficulty Laryngoscope Size: Mac and 3 Grade View: Grade I Tube type: Oral Number of attempts: 1 Airway Equipment and Method: Stylet Placement Confirmation: ETT inserted through vocal cords under direct vision, positive ETCO2 and breath sounds checked- equal and bilateral Secured at: 21 cm Tube secured with: Tape Dental Injury: Teeth and Oropharynx as per pre-operative assessment

## 2022-01-04 NOTE — Transfer of Care (Signed)
Immediate Anesthesia Transfer of Care Note  Patient: Manroop Jakubowicz  Procedure(s) Performed: XI ROBOTIC ASSISTED BILATERAL SALPINGO OOPHORECTOMY (Bilateral)  Patient Location: PACU  Anesthesia Type:General  Level of Consciousness: awake, alert  and oriented  Airway & Oxygen Therapy: Patient Spontanous Breathing and Patient connected to face mask oxygen  Post-op Assessment: Report given to RN and Post -op Vital signs reviewed and stable  Post vital signs: Reviewed and stable  Last Vitals:  Vitals Value Taken Time  BP 157/84 01/04/22 1453  Temp    Pulse 66 01/04/22 1455  Resp 16 01/04/22 1455  SpO2 100 % 01/04/22 1455  Vitals shown include unvalidated device data.  Last Pain:  Vitals:   01/04/22 1007  TempSrc: Oral  PainSc: 0-No pain         Complications: No notable events documented.

## 2022-01-04 NOTE — Anesthesia Preprocedure Evaluation (Addendum)
Anesthesia Evaluation  Patient identified by MRN, date of birth, ID band Patient awake    Reviewed: Allergy & Precautions, NPO status , Patient's Chart, lab work & pertinent test results  Airway Mallampati: II  TM Distance: >3 FB Neck ROM: Full    Dental no notable dental hx.    Pulmonary neg pulmonary ROS,    Pulmonary exam normal        Cardiovascular negative cardio ROS   Rhythm:Regular Rate:Normal     Neuro/Psych negative neurological ROS  negative psych ROS   GI/Hepatic negative GI ROS, Neg liver ROS,   Endo/Other  negative endocrine ROS  Renal/GU negative Renal ROS  Female GU complaint Adnexal mass    Musculoskeletal negative musculoskeletal ROS (+)   Abdominal Normal abdominal exam  (+)   Peds  Hematology negative hematology ROS (+)   Anesthesia Other Findings   Reproductive/Obstetrics                            Anesthesia Physical Anesthesia Plan  ASA: 2  Anesthesia Plan: General   Post-op Pain Management: Celebrex PO (pre-op)*, Tylenol PO (pre-op)* and Gabapentin PO (pre-op)*   Induction: Intravenous  PONV Risk Score and Plan: 3 and Ondansetron, Dexamethasone, Midazolam and Treatment may vary due to age or medical condition  Airway Management Planned: Mask and Oral ETT  Additional Equipment: None  Intra-op Plan:   Post-operative Plan: Extubation in OR  Informed Consent: I have reviewed the patients History and Physical, chart, labs and discussed the procedure including the risks, benefits and alternatives for the proposed anesthesia with the patient or authorized representative who has indicated his/her understanding and acceptance.     Dental advisory given  Plan Discussed with: CRNA  Anesthesia Plan Comments: (Lab Results      Component                Value               Date                      WBC                      3.9 (L)             12/28/2021                 HGB                      13.3                12/28/2021                HCT                      41.2                12/28/2021                MCV                      96.9                12/28/2021                PLT  249                 12/28/2021           Lab Results      Component                Value               Date                      NA                       142                 12/28/2021                K                        4.1                 12/28/2021                CO2                      27                  12/28/2021                GLUCOSE                  89                  12/28/2021                BUN                      11                  12/28/2021                CREATININE               0.65                12/28/2021                CALCIUM                  9.8                 12/28/2021                GFRNONAA                 >60                 12/28/2021          )        Anesthesia Quick Evaluation

## 2022-01-04 NOTE — Interval H&P Note (Signed)
History and Physical Interval Note:  01/04/2022 11:50 AM  Mary Warren  has presented today for surgery, with the diagnosis of ADNEXAL MASS.  The various methods of treatment have been discussed with the patient and family. After consideration of risks, benefits and other options for treatment, the patient has consented to  Procedure(s): XI ROBOTIC ASSISTED BILATERAL SALPINGO OOPHORECTOMY (Bilateral) POSSIBLE XI ROBOTIC ASSISTED TOTAL HYSTERECTOMY (N/A) POSSIBLE LAPAROTOMY WITH STAGING (N/A) as a surgical intervention.  The patient's history has been reviewed, patient examined, no change in status, stable for surgery.  I have reviewed the patient's chart and labs.  Questions were answered to the patient's satisfaction.     Mary Warren

## 2022-01-04 NOTE — Progress Notes (Signed)
Per Dr. Berline Lopes - ok for no ABX

## 2022-01-04 NOTE — Op Note (Signed)
OPERATIVE NOTE  Pre-operative Diagnosis: Adnexal mass, family history of ovarian cancer  Post-operative Diagnosis: same, benign ovarian cyst  Operation: Robotic-assisted laparoscopic bilateral salpingoophorectomy   Surgeon: Jeral Pinch MD  Assistant Surgeon: Lahoma Crocker MD (an MD assistant was necessary for tissue manipulation, management of robotic instrumentation, retraction and positioning due to the complexity of the case and hospital policies).   Anesthesia: GET  Urine Output: 500cc  Operative Findings:  : Retroverted 6 cm uterus on EUA. On intra-abdominal entry, normal upper abdominal survey. Omentum with filmy adhesions to the anterior abdominal wall and falciform ligament. Normal appearing small and large bowel. Right ovary mildly enlarged with 1 cm cystic lesions. Bilateral tubes and left ovarynormal in appearance. Uterus 6 cm with 1-2 cm posterior and fundal fibroid. No ascites.   Estimated Blood Loss:  25 cc      Total IV Fluids: see I&O flowsheet         Specimens: bilateral tubes and ovaries, pelvic washings         Complications:  None apparent; patient tolerated the procedure well.         Disposition: PACU - hemodynamically stable.  Procedure Details  The patient was seen in the Holding Room. The risks, benefits, complications, treatment options, and expected outcomes were discussed with the patient.  The patient concurred with the proposed plan, giving informed consent.  The site of surgery properly noted/marked. The patient was identified as Mary Warren and the procedure verified as a Robotic-assisted bilateral salpingo-oophorectomy with any other indicated procedures.   After induction of anesthesia, the patient was draped and prepped in the usual sterile manner. Patient was placed in supine position after anesthesia and draped and prepped in the usual sterile manner as follows: Her arms were tucked to her side with all appropriate precautions.  The  shoulders were stabilized with padded shoulder blocks applied to the acromium processes.  The patient was placed in the semi-lithotomy position in [place].  The perineum and vagina were prepped with CholoraPrep. The patient was draped after the CholoraPrep had been allowed to dry for 3 minutes.  A Time Out was held and the above information confirmed.  The urethra was prepped with Betadine. Foley catheter was placed.  A sterile speculum was placed in the vagina.  The cervix was grasped with a single-tooth tenaculum. The cervix was dilated with Kennon Rounds dilators.  A Hulka manipulator was placed. OG tube placement was confirmed and to suction.   Next, a 10 mm skin incision was made 1 cm below the subcostal margin in the midclavicular line.  The 5 mm Optiview port and scope was used for direct entry.  Opening pressure was under 10 mm CO2.  The abdomen was insufflated and the findings were noted as above.   At this point and all points during the procedure, the patient's intra-abdominal pressure did not exceed 15 mmHg. Next, an 8 mm skin incision was made superior to the umbilicus and a right and left port were placed about 8 cm lateral to the robot port on the right and left side.  The 5 mm assist trocar was exchanged for a 10-12 mm port. All ports were placed under direct visualization.  The patient was placed in steep Trendelenburg.  Bowel was already noted in the upper abdomen.  The robot was docked in the normal manner.  A loop of the small bowel mildly adherent to the right IP ligament over the pelvic brim was mobilized using sharp dissection. The  right and left peritoneum were opened parallel to the IP ligament to open the retroperitoneal spaces bilaterally. The round ligaments were preserved. The ureter was noted to be on the medial leaf of the broad ligament.  The peritoneum above the ureter was incised and stretched and the infundibulopelvic ligament was skeletonized, cauterized and cut.  The  utero-ovarian ligament and fallopian tube were skeletonized, cauterized and transected just lateral to the uterine fundus, freeing the adnexa. Each adnexa was placed in an Endocatch bag and removed in contained fashion. The right adnexa was sent for frozen section.  Once frozen returned as benign, irrigation was used and excellent hemostasis was achieved.  At this point in the procedure was completed.  Robotic instruments were removed under direct visulaization.  The robot was undocked. The omentum was mobilized using laparoscopic scissors from the anterior abdominal wall medial and superior to the assist trocar. The fascia at the 10-12 mm port was closed with 0 Vicryl using a PMI fascial closure device.  The subcuticular tissue was closed with 4-0 Vicryl and the skin was closed with 4-0 Monocryl in a subcuticular manner.  Dermabond was applied.    The vagina was swabbed with no bleeding noted. Foley catheter was removed.  All sponge, lap and needle counts were correct x  3.   The patient was transferred to the recovery room in stable condition.  Jeral Pinch, MD

## 2022-01-04 NOTE — Progress Notes (Signed)
No order for pre-op ABX. Call to Dr. Berline Lopes. Per Dr. Berline Lopes will be addressed when she sees the patient pre-procedure.

## 2022-01-05 ENCOUNTER — Telehealth: Payer: Self-pay

## 2022-01-05 ENCOUNTER — Encounter (HOSPITAL_COMMUNITY): Payer: Self-pay | Admitting: Gynecologic Oncology

## 2022-01-05 NOTE — Anesthesia Postprocedure Evaluation (Signed)
Anesthesia Post Note  Patient: Mary Warren  Procedure(s) Performed: XI ROBOTIC ASSISTED BILATERAL SALPINGO OOPHORECTOMY (Bilateral)     Patient location during evaluation: PACU Anesthesia Type: General Level of consciousness: awake and alert Pain management: pain level controlled Vital Signs Assessment: post-procedure vital signs reviewed and stable Respiratory status: spontaneous breathing, nonlabored ventilation, respiratory function stable and patient connected to nasal cannula oxygen Cardiovascular status: blood pressure returned to baseline and stable Postop Assessment: no apparent nausea or vomiting Anesthetic complications: no   No notable events documented.  Last Vitals:  Vitals:   01/04/22 1535 01/04/22 1700  BP: (!) 149/85 (!) 158/71  Pulse: 65 71  Resp: 14 14  Temp: 36.5 C   SpO2: 99% 100%    Last Pain:  Vitals:   01/04/22 1700  TempSrc:   PainSc: 0-No pain                 Giulietta Prokop S

## 2022-01-05 NOTE — Telephone Encounter (Signed)
Spoke with Ms. Glover this morning. She states she is eating, drinking and urinating well. She has not had a BM yet but is passing gas. She is taking senokot as prescribed and encouraged her to drink plenty of water. She denies fever or chills. Incisions are dry and intact. She rates her pain 4-5/10/10. Her pain is controlled with Tylenol/Ibuprofen. She has not taken Tramadol.    Instructed to call office with any fever, chills, purulent drainage, uncontrolled pain or any other questions or concerns. Patient verbalizes understanding.   Pt aware of post op appointments as well as the office number [phone] and after hours number [phone] to call if she has any questions or concerns   Pt states she is flying out on 10/6, post op date, and needing to reschedule. New appointment is 10/10 @ 3:30. Pt agreed to date/time

## 2022-01-06 ENCOUNTER — Telehealth: Payer: Self-pay | Admitting: Gynecologic Oncology

## 2022-01-06 LAB — CYTOLOGY - NON PAP

## 2022-01-06 LAB — SURGICAL PATHOLOGY

## 2022-01-06 NOTE — Telephone Encounter (Signed)
Called patient with pathology from surgery. She is very happy with this news.  Jeral Pinch MD Gynecologic Oncology

## 2022-01-09 ENCOUNTER — Telehealth: Payer: Self-pay

## 2022-01-09 NOTE — Telephone Encounter (Signed)
Pt called today stating she had surgery last week with Dr.Tucker.  She states overall she has been doing fine. Eating/drinking well. No fever/chills. Pain is 2/10 taking Ibuprofen every 6 hours, thinking she will bump it out to every 8 as needed.  She has 2 concerns.  One being she has numbness on the upper left thigh about the size of a grapefruit. No pain/redness in that area. She has started working from home and has been sitting quite a bit but wanted to make [place] aware.  Second, she has been spotting. However, her first BM was yesterday and she wasn't sure if some of the blood was from rectum or vaginal.   Notified pt of the numbness could be from the position she was in during surgery. Advised walking and putting a heating pad on area.  I also notified her some spotting can be normal with the healing process. She knows to notify us if heavy bright red blood and starts having pain not relieved by medication.  Pt aware I will pass the message along to Dr.Tucker and [name] NP and will call her back with advice.

## 2022-01-10 ENCOUNTER — Encounter: Payer: Self-pay | Admitting: Gynecologic Oncology

## 2022-01-10 ENCOUNTER — Inpatient Hospital Stay (HOSPITAL_BASED_OUTPATIENT_CLINIC_OR_DEPARTMENT_OTHER): Payer: BLUE CROSS/BLUE SHIELD | Admitting: Gynecologic Oncology

## 2022-01-10 DIAGNOSIS — N9489 Other specified conditions associated with female genital organs and menstrual cycle: Secondary | ICD-10-CM | POA: Diagnosis not present

## 2022-01-10 DIAGNOSIS — Z8041 Family history of malignant neoplasm of ovary: Secondary | ICD-10-CM

## 2022-01-10 DIAGNOSIS — R419 Unspecified symptoms and signs involving cognitive functions and awareness: Secondary | ICD-10-CM | POA: Diagnosis not present

## 2022-01-10 NOTE — Telephone Encounter (Signed)
Follow up call from yesterday 9/18.  Pt states she still has a little numbness in her upper thigh no trouble walking and no swelling.  Still has a small amount of spotting. I relayed the message from [name] NP. Pt stated she noticed a little dizziness. Advised pt to make sure to push water intake to prevent dehydration and also eat little meals/snack so sugar will not drop. She stated she would do better about this.   Pt states she will watch for any of the above S&S and call office if needed. Pt aware of the after hours number.

## 2022-01-10 NOTE — Progress Notes (Signed)
Gynecologic Oncology Telehealth Note: Gyn-Onc  I connected with Mary Warren on 01/10/22 at  4:00 PM EDT by telephone and verified that I am speaking with the correct person using two identifiers.  I discussed the limitations, risks, security and privacy concerns of performing an evaluation and management service by telemedicine and the availability of in-person appointments. I also discussed with the patient that there may be a patient responsible charge related to this service. The patient expressed understanding and agreed to proceed.  Other persons participating in the visit and their role in the encounter: none.  Patient's location: home Provider's location: [facility]  Reason for Visit: follow-up after surgery  Treatment History: 01/04/22: Robotic assisted BSO  Interval History: Worked for a good portion of yesterday, uncomfortable sitting in office chair. This morning felt very light headed. Now feeling better but has some brain "fog". Had night sweats the last two nights.  First bowel movement was yesterday. Had little spotting, unsure if from vagina or rectum. Reports normal bladder function.  Tolerating p.o. without issues.  Past Medical/Surgical History: Past Medical History:  Diagnosis Date   Adnexal mass    Family history of ovarian cancer    Ovarian cyst    Pneumonia due to COVID-19 virus 05/21/2019    Past Surgical History:  Procedure Laterality Date   ADENOIDECTOMY     ROBOTIC ASSISTED BILATERAL SALPINGO OOPHERECTOMY Bilateral 01/04/2022   Procedure: XI ROBOTIC ASSISTED BILATERAL SALPINGO OOPHORECTOMY;  Surgeon: Lafonda Mosses, MD;  Location: [facility];  Service: Gynecology;  Laterality: Bilateral;   WISDOM TOOTH EXTRACTION      Family History  Problem Relation Age of Onset   Drug abuse Mother    COPD Mother    Mental retardation Mother    Early death Mother 30       blood clot   Ovarian cancer Mother 8       dx 'late July 15, 2022', died at 85 due to COPD   Heart  disease Mother    Breast cancer Paternal Aunt 104       is now in 43's   Alcohol abuse Sister    Heart disease Maternal Uncle        died late 8's   Heart attack Maternal Grandmother 52    Social History   Socioeconomic History   Marital status: Married    Spouse name: Not on file   Number of children: Not on file   Years of education: Not on file   Highest education level: Not on file  Occupational History   Occupation: financial controller  Tobacco Use   Smoking status: Never   Smokeless tobacco: Never  Vaping Use   Vaping Use: Never used  Substance and Sexual Activity   Alcohol use: Yes    Alcohol/week: 2.0 - 3.0 standard drinks of alcohol    Types: 2 - 3 Glasses of wine per week    Comment: socially   Drug use: No   Sexual activity: Yes    Partners: Male    Birth control/protection: Other-see comments    Comment: Husband had a vasectomy  Other Topics Concern   Not on file  Social History Narrative   ** Merged History Encounter **       Husband, 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  Smoke detector in the home. Wears her set belt. Feels safe in her relationships.     Social Determinants of Health   Financial Resource Strain: Not on file  Food Insecurity: Not on file  Transportation Needs: Not on file  Physical Activity: Not on file  Stress: Not on file  Social Connections: Not on file    Current Medications:  Current Outpatient Medications:    ALOE VERA JUICE PO, Take 118 mLs by mouth daily., Disp: , Rfl:    APPLE CIDER VINEGAR PO, Take 15 mLs by mouth daily., Disp: , Rfl:    b complex vitamins capsule, Take 1 capsule by mouth daily., Disp: , Rfl:    Bromelains (BROMELAIN PO), Take 140 mg by mouth daily., Disp: , Rfl:    Cholecalciferol (VITAMIN D) 125 MCG (5000 UT) CAPS, Take 5,000 Units by mouth daily., Disp: , Rfl:    Ginger, Zingiber officinalis, (GINGER PO), Take 150 mg by mouth daily., Disp: , Rfl:     ibuprofen (ADVIL) 800 MG tablet, Take 1 tablet (800 mg total) by mouth every 8 (eight) hours as needed for moderate pain. For AFTER surgery only, Disp: 30 tablet, Rfl: 0   Lactobacillus (PROBIOTIC ACIDOPHILUS) CAPS, Take 1 capsule by mouth daily., Disp: , Rfl:    Magnesium 200 MG TABS, Take 200 mg by mouth daily., Disp: , Rfl:    Multiple Vitamin (MULTIVITAMIN) tablet, Take 1 tablet by mouth daily., Disp: , Rfl:    OIL OF OREGANO PO, Take 230 mg by mouth daily., Disp: , Rfl:    Omega-3 Fatty Acids (OMEGA 3 PO), Take 292 mg by mouth daily., Disp: , Rfl:    OVER THE COUNTER MEDICATION, Take 500 mg by mouth daily. Bacopa, Disp: , Rfl:    OVER THE COUNTER MEDICATION, Take 2 capsules by mouth daily. Adrenal Health - 520 mg per capsule, Disp: , Rfl:    OVER THE COUNTER MEDICATION, Take 36 mg by mouth daily. Curcuminoids, Disp: , Rfl:    OVER THE COUNTER MEDICATION, Take 450 mg by mouth daily. Reishi Mushroom, Disp: , Rfl:    OVER THE COUNTER MEDICATION, Take 1 capsule by mouth daily. Estrotone: Evening Primrose Oil - 200 mg, Schizandra - 160 mg, Ginger - 50 mg, Black Cohosh - 40 mg, Chaste Tree - 40 mg, Rosemary - 10 mg, Disp: , Rfl:    Potassium 99 MG TABS, Take 99 mg by mouth daily., Disp: , Rfl:    Quercetin 50 MG TABS, Take 50 mg by mouth daily., Disp: , Rfl:    selenium 200 MCG TABS tablet, Take 200 mcg by mouth daily., Disp: , Rfl:    Spirulina 500 MG TABS, Take 500 mg by mouth daily., Disp: , Rfl:    traMADol (ULTRAM) 50 MG tablet, Take 1 tablet (50 mg total) by mouth every 6 (six) hours as needed for severe pain. For AFTER surgery only, do not take and drive, Disp: 10 tablet, Rfl: 0   TURMERIC PO, Take 482 mg by mouth daily., Disp: , Rfl:    vitamin C (ASCORBIC ACID) 250 MG tablet, Take 250 mg by mouth daily., Disp: , Rfl:    Zinc 30 MG CAPS, Take 30 mg by mouth daily., Disp: , Rfl:   Review of Symptoms: Pertinent positives as per HPI.  Physical Exam: Deferred given limitations of phone  visit.  Laboratory & Radiologic Studies: A.   OVARY AND FALLOPIAN TUBE, RIGHT, SALPINGO OOPHORECTOMY:  -    Fibroma, ovarian.  -    Ovary with senescent features.  -    Aggregates of hemosiderin-laden macrophages, likely from involuting  follicle; no diagnostic endometriosis identified.  -    Fallopian  tube without identified significant epithelial atypia.  -    No malignancy identified.   B.   OVARY AND FALLOPIAN TUBE, LEFT, SALPINGO OOPHORECTOMY:  -    Ovary with senescent features.  -    Fallopian tube without identified significant epithelial atypia,  and few cystic Walthard rests.  -    No malignancy identified.  Assessment & Plan: Mary Warren is a 56 y.o. woman s/p robotic BSO.  Overall doing well, meeting postoperative milestones.  Did not feel as well today after working almost a full day at home yesterday for the first time since surgery.  Felt lightheaded this morning.  This has resolved but still feels somewhat "foggy".  Discussed that this may be related to increased activity and working yesterday.  Encouraged continued resting today and to call and let me know how she is feeling tomorrow.  Although menopausal, we discussed that after removal of both ovaries, there can be withdrawal of estrogen production and still occurring by the ovaries.  This would be a little early for the patient to be starting to have symptoms, but is absolutely possible.  If she does not see an improvement in her symptoms, we could start a low-dose hormonal therapy to see if this helps her feel better.  I discussed the assessment and treatment plan with the patient. The patient was provided with an opportunity to ask questions and all were answered. The patient agreed with the plan and demonstrated an understanding of the instructions.   The patient was advised to call back or see an in-person evaluation if the symptoms worsen or if the condition fails to improve as anticipated.   8 minutes of total  time was spent for this patient encounter, including preparation, phone counseling with the patient and coordination of care, and documentation of the encounter.   Jeral Pinch, MD  Division of Gynecologic Oncology  Department of Obstetrics and Gynecology  [facility]

## 2022-01-12 ENCOUNTER — Telehealth: Payer: Self-pay

## 2022-01-12 NOTE — Telephone Encounter (Signed)
Ms Bhullar states that she is experiencing some spotting on a panty liner.  She saw a small amount of blood in the commode today and was concerned. She is post menopausal. She is having some abdominal cramping. She is 1 week post op. She started working from home on Monday.  She did not work Tuesday as she did not feel up to it.  She has bee sitting at a desk working. Told her that she is using her abdominal muscles that have just been cut through so she does not have a lot of core strength.  She will have some spotting from the vaginal manipulator used during surgery per [name], NP.  She should call if she has bleeding like a period.  She could use yoga pants for some abdominal support. Use heat to her back and put a pillow behind her back in chair for support while working. She cut down her Ibuprofen to 200 mg since her pain was decreasing.  She is working with a dealine and told her that she is probably tense and her abdomen is hurting more. Instructed her to increas her Ibuprofen to 800 mg q 8 hours alt. With Tylenol.  Try to take a brake from working to eat and take ibuprofen. Ly down and use heating pad to back to relax. Pt verbalized understanding.

## 2022-01-13 ENCOUNTER — Ambulatory Visit: Payer: BLUE CROSS/BLUE SHIELD

## 2022-01-27 ENCOUNTER — Encounter: Payer: BLUE CROSS/BLUE SHIELD | Admitting: Gynecologic Oncology

## 2022-01-31 ENCOUNTER — Other Ambulatory Visit: Payer: Self-pay

## 2022-01-31 ENCOUNTER — Encounter: Payer: Self-pay | Admitting: Gynecologic Oncology

## 2022-01-31 ENCOUNTER — Inpatient Hospital Stay: Payer: BLUE CROSS/BLUE SHIELD | Attending: Gynecologic Oncology | Admitting: Gynecologic Oncology

## 2022-01-31 VITALS — BP 143/79 | HR 61 | Temp 98.4°F | Resp 18

## 2022-01-31 DIAGNOSIS — N9489 Other specified conditions associated with female genital organs and menstrual cycle: Secondary | ICD-10-CM

## 2022-01-31 DIAGNOSIS — Z8041 Family history of malignant neoplasm of ovary: Secondary | ICD-10-CM

## 2022-01-31 NOTE — Progress Notes (Signed)
Gynecologic Oncology Return Clinic Visit  01/31/2022  Reason for Visit: Follow-up after surgery  Treatment History: 01/04/22: Robotic assisted BSO  Interval History: Doing well.  Has some slight numbness in her left thigh, improving since surgery.  Reports baseline bowel bladder function.  Denies any vaginal bleeding or discharge.  Denies any abdominal or pelvic pain.  Past Medical/Surgical History: Past Medical History:  Diagnosis Date   Adnexal mass    Family history of ovarian cancer    Ovarian cyst    Pneumonia due to COVID-19 virus 05/21/2019    Past Surgical History:  Procedure Laterality Date   ADENOIDECTOMY     ROBOTIC ASSISTED BILATERAL SALPINGO OOPHERECTOMY Bilateral 01/04/2022   Procedure: XI ROBOTIC ASSISTED BILATERAL SALPINGO OOPHORECTOMY;  Surgeon: Lafonda Mosses, MD;  Location: [facility];  Service: Gynecology;  Laterality: Bilateral;   WISDOM TOOTH EXTRACTION      Family History  Problem Relation Age of Onset   Drug abuse Mother    COPD Mother    Mental retardation Mother    Early death Mother 30       blood clot   Ovarian cancer Mother 59       dx 'late Aug 03, 2022', died at 57 due to COPD   Heart disease Mother    Breast cancer Paternal Aunt 61       is now in 80's   Alcohol abuse Sister    Heart disease Maternal Uncle        died late 46's   Heart attack Maternal Grandmother 52    Social History   Socioeconomic History   Marital status: Married    Spouse name: Not on file   Number of children: Not on file   Years of education: Not on file   Highest education level: Not on file  Occupational History   Occupation: financial controller  Tobacco Use   Smoking status: Never   Smokeless tobacco: Never  Vaping Use   Vaping Use: Never used  Substance and Sexual Activity   Alcohol use: Yes    Alcohol/week: 2.0 - 3.0 standard drinks of alcohol    Types: 2 - 3 Glasses of wine per week    Comment: socially   Drug use: No   Sexual activity: Yes     Partners: Male    Birth control/protection: Other-see comments    Comment: Husband had a vasectomy  Other Topics Concern   Not on file  Social History Narrative   ** Merged History Encounter **       Husband, 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  Smoke detector in the home. Wears her set belt. Feels safe in her relationships.     Social Determinants of Health   Financial Resource Strain: Not on file  Food Insecurity: Not on file  Transportation Needs: Not on file  Physical Activity: Not on file  Stress: Not on file  Social Connections: Not on file    Current Medications:  Current Outpatient Medications:    ALOE VERA JUICE PO, Take 118 mLs by mouth daily., Disp: , Rfl:    APPLE CIDER VINEGAR PO, Take 15 mLs by mouth daily., Disp: , Rfl:    b complex vitamins capsule, Take 1 capsule by mouth daily., Disp: , Rfl:    Bromelains (BROMELAIN PO), Take 140 mg by mouth daily., Disp: , Rfl:    Cholecalciferol (VITAMIN D) 125 MCG (5000 UT) CAPS, Take 5,000 Units by mouth daily., Disp: , Rfl:  Ginger, Zingiber officinalis, (GINGER PO), Take 150 mg by mouth daily., Disp: , Rfl:    Lactobacillus (PROBIOTIC ACIDOPHILUS) CAPS, Take 1 capsule by mouth daily., Disp: , Rfl:    Magnesium 200 MG TABS, Take 200 mg by mouth daily., Disp: , Rfl:    Multiple Vitamin (MULTIVITAMIN) tablet, Take 1 tablet by mouth daily., Disp: , Rfl:    OIL OF OREGANO PO, Take 230 mg by mouth daily., Disp: , Rfl:    Omega-3 Fatty Acids (OMEGA 3 PO), Take 292 mg by mouth daily., Disp: , Rfl:    OVER THE COUNTER MEDICATION, Take 500 mg by mouth daily. Bacopa, Disp: , Rfl:    OVER THE COUNTER MEDICATION, Take 2 capsules by mouth daily. Adrenal Health - 520 mg per capsule, Disp: , Rfl:    OVER THE COUNTER MEDICATION, Take 36 mg by mouth daily. Curcuminoids, Disp: , Rfl:    OVER THE COUNTER MEDICATION, Take 450 mg by mouth daily. Reishi Mushroom, Disp: , Rfl:    OVER THE  COUNTER MEDICATION, Take 1 capsule by mouth daily. Estrotone: Evening Primrose Oil - 200 mg, Schizandra - 160 mg, Ginger - 50 mg, Black Cohosh - 40 mg, Chaste Tree - 40 mg, Rosemary - 10 mg, Disp: , Rfl:    Potassium 99 MG TABS, Take 99 mg by mouth daily., Disp: , Rfl:    Quercetin 50 MG TABS, Take 50 mg by mouth daily., Disp: , Rfl:    selenium 200 MCG TABS tablet, Take 200 mcg by mouth daily., Disp: , Rfl:    Spirulina 500 MG TABS, Take 500 mg by mouth daily., Disp: , Rfl:    TURMERIC PO, Take 482 mg by mouth daily., Disp: , Rfl:    vitamin C (ASCORBIC ACID) 250 MG tablet, Take 250 mg by mouth daily., Disp: , Rfl:    Zinc 30 MG CAPS, Take 30 mg by mouth daily., Disp: , Rfl:   Review of Systems: Denies appetite changes, fevers, chills, fatigue, unexplained weight changes. Denies hearing loss, neck lumps or masses, mouth sores, ringing in ears or voice changes. Denies cough or wheezing.  Denies shortness of breath. Denies chest pain or palpitations. Denies leg swelling. Denies abdominal distention, pain, blood in stools, constipation, diarrhea, nausea, vomiting, or early satiety. Denies pain with intercourse, dysuria, frequency, hematuria or incontinence. Denies hot flashes, pelvic pain, vaginal bleeding or vaginal discharge.   Denies joint pain, back pain or muscle pain/cramps. Denies itching, rash, or wounds. Denies dizziness, headaches, or seizures. Denies swollen lymph nodes or glands, denies easy bruising or bleeding. Denies anxiety, depression, confusion, or decreased concentration.  Physical Exam: BP (!) 143/79 (BP Location: Right Arm, Patient Position: Sitting)   Pulse 61   Temp 98.4 F (36.9 C) (Oral)   Resp 18   LMP 02/24/2019 (Approximate)   SpO2 100%  General: Alert, oriented, no acute distress. HEENT: Normocephalic, atraumatic, sclera anicteric. Chest: Unlabored breathing on room air. Abdomen: Soft, nontender.  Normoactive bowel sounds.  No masses or hepatosplenomegaly  appreciated.  Well-healed incisions.  Remaining Dermabond removed. Extremities: Grossly normal range of motion.  Warm, well perfused.  No edema bilaterally.  Laboratory & Radiologic Studies: A.   OVARY AND FALLOPIAN TUBE, RIGHT, SALPINGO OOPHORECTOMY:  -    Fibroma, ovarian.  -    Ovary with senescent features.  -    Aggregates of hemosiderin-laden macrophages, likely from involuting  follicle; no diagnostic endometriosis identified.  -    Fallopian tube without identified significant epithelial atypia.  -  No malignancy identified.   B.   OVARY AND FALLOPIAN TUBE, LEFT, SALPINGO OOPHORECTOMY:  -    Ovary with senescent features.  -    Fallopian tube without identified significant epithelial atypia,  and few cystic Walthard rests.  -    No malignancy identified  A. PELVIC, WASHING:  FINAL MICROSCOPIC DIAGNOSIS:  - No malignant cells identified   Assessment & Plan: Mary Warren is a 56 y.o. woman status post robotic BSO in the setting of a complex adnexal mass and first-degree relative with ovarian cancer.  Final pathology revealed benign adnexa.  Patient is overall doing well and meeting postoperative milestones.  Discussed continued expectations and restrictions.  Patient is already having improvement of her left lateral leg paresthesia.  Suspect this was related to trocar placement and I am hopeful it will resolve completely.  All patient's questions were answered.  She was given a paper copy of her pathology report.  We reviewed recommendations for follow-up with her OB/GYN.  18 minutes of total time was spent for this patient encounter, including preparation, face-to-face counseling with the patient and coordination of care, and documentation of the encounter.  Jeral Pinch, MD  Division of Gynecologic Oncology  Department of Obstetrics and Gynecology  [facility]

## 2022-01-31 NOTE — Patient Instructions (Signed)
It was great to see you today.  You are healing well from surgery.  Please remember, no heavy lifting until 5-6 weeks after surgery.  Please call if you need anything in the future.  Otherwise, I recommend continued follow-up with your PCP and OB/GYN.

## 2022-02-08 ENCOUNTER — Ambulatory Visit
Admission: RE | Admit: 2022-02-08 | Discharge: 2022-02-08 | Disposition: A | Discharge status: Home / Self Care | Payer: BLUE CROSS/BLUE SHIELD | Source: Ambulatory Visit | Attending: Family Medicine | Admitting: Family Medicine

## 2022-02-08 DIAGNOSIS — Z1231 Encounter for screening mammogram for malignant neoplasm of breast: Secondary | ICD-10-CM

## 2022-07-07 ENCOUNTER — Ambulatory Visit
Admission: EM | Admit: 2022-07-07 | Discharge: 2022-07-07 | Disposition: A | Discharge status: Home / Self Care | Payer: BLUE CROSS/BLUE SHIELD | Attending: Urgent Care | Admitting: Urgent Care

## 2022-07-07 DIAGNOSIS — N760 Acute vaginitis: Secondary | ICD-10-CM | POA: Diagnosis present

## 2022-07-07 DIAGNOSIS — N898 Other specified noninflammatory disorders of vagina: Secondary | ICD-10-CM | POA: Diagnosis present

## 2022-07-07 DIAGNOSIS — R102 Pelvic and perineal pain: Secondary | ICD-10-CM | POA: Diagnosis present

## 2022-07-07 LAB — POCT URINALYSIS DIP (MANUAL ENTRY)
Bilirubin, UA: NEGATIVE
Glucose, UA: NEGATIVE mg/dL
Ketones, POC UA: NEGATIVE mg/dL
Nitrite, UA: NEGATIVE
Protein Ur, POC: NEGATIVE mg/dL
Spec Grav, UA: 1.01 (ref 1.010–1.025)
Urobilinogen, UA: 0.2 E.U./dL
pH, UA: 7 (ref 5.0–8.0)

## 2022-07-07 MED ORDER — METRONIDAZOLE 500 MG PO TABS: 500.0000 mg | ORAL_TABLET | Freq: Two times a day (BID) | ORAL | 0 refills | Status: DC

## 2022-07-07 MED ORDER — FLUCONAZOLE 150 MG PO TABS: 150.0000 mg | ORAL_TABLET | ORAL | 0 refills | Status: DC

## 2022-07-07 NOTE — ED Provider Notes (Signed)
[facility]  Note:  This document was prepared using Systems analyst and may include unintentional dictation errors.  MRN: [id number] DOB: 12-30-1965  Subjective:   Mary Warren is a 57 y.o. female presenting for 4-day history of persistent vaginal discharge, lower back pain that is mild, mild intermittent pelvic cramping.  Symptoms started after she had sex on Sunday.  This past October of 2023, had bilateral salpingo-oophorectomy. Since then has had issues with the vaginal issues. Denies fever, n/v, rashes, dysuria, urinary frequency, hematuria.  Hydrates very well.  She does drink a lot of caffeinated tea.  No other urinary irritants.  No current facility-administered medications for this encounter.  Current Outpatient Medications:    ALOE VERA JUICE PO, Take 118 mLs by mouth daily., Disp: , Rfl:    APPLE CIDER VINEGAR PO, Take 15 mLs by mouth daily., Disp: , Rfl:    b complex vitamins capsule, Take 1 capsule by mouth daily., Disp: , Rfl:    Bromelains (BROMELAIN PO), Take 140 mg by mouth daily., Disp: , Rfl:    Cholecalciferol (VITAMIN D) 125 MCG (5000 UT) CAPS, Take 5,000 Units by mouth daily., Disp: , Rfl:    Ginger, Zingiber officinalis, (GINGER PO), Take 150 mg by mouth daily., Disp: , Rfl:    Lactobacillus (PROBIOTIC ACIDOPHILUS) CAPS, Take 1 capsule by mouth daily., Disp: , Rfl:    Magnesium 200 MG TABS, Take 200 mg by mouth daily., Disp: , Rfl:    Multiple Vitamin (MULTIVITAMIN) tablet, Take 1 tablet by mouth daily., Disp: , Rfl:    OIL OF OREGANO PO, Take 230 mg by mouth daily., Disp: , Rfl:    Omega-3 Fatty Acids (OMEGA 3 PO), Take 292 mg by mouth daily., Disp: , Rfl:    OVER THE COUNTER MEDICATION, Take 500 mg by mouth daily. Bacopa, Disp: , Rfl:    OVER THE COUNTER MEDICATION, Take 2 capsules by mouth daily. Adrenal Health - 520 mg per capsule, Disp: , Rfl:    OVER THE COUNTER MEDICATION, Take 36 mg by mouth daily. Curcuminoids,  Disp: , Rfl:    OVER THE COUNTER MEDICATION, Take 450 mg by mouth daily. Reishi Mushroom, Disp: , Rfl:    OVER THE COUNTER MEDICATION, Take 1 capsule by mouth daily. Estrotone: Evening Primrose Oil - 200 mg, Schizandra - 160 mg, Ginger - 50 mg, Black Cohosh - 40 mg, Chaste Tree - 40 mg, Rosemary - 10 mg, Disp: , Rfl:    Potassium 99 MG TABS, Take 99 mg by mouth daily., Disp: , Rfl:    Quercetin 50 MG TABS, Take 50 mg by mouth daily., Disp: , Rfl:    selenium 200 MCG TABS tablet, Take 200 mcg by mouth daily., Disp: , Rfl:    Spirulina 500 MG TABS, Take 500 mg by mouth daily., Disp: , Rfl:    TURMERIC PO, Take 482 mg by mouth daily., Disp: , Rfl:    vitamin C (ASCORBIC ACID) 250 MG tablet, Take 250 mg by mouth daily., Disp: , Rfl:    Zinc 30 MG CAPS, Take 30 mg by mouth daily., Disp: , Rfl:    No Known Allergies  Past Medical History:  Diagnosis Date   Adnexal mass    Family history of ovarian cancer    Ovarian cyst    Pneumonia due to COVID-19 virus 05/21/2019     Past Surgical History:  Procedure Laterality Date   ADENOIDECTOMY     ROBOTIC ASSISTED BILATERAL  SALPINGO OOPHERECTOMY Bilateral 01/04/2022   Procedure: XI ROBOTIC ASSISTED BILATERAL SALPINGO OOPHORECTOMY;  Surgeon: Lafonda Mosses, MD;  Location: [facility];  Service: Gynecology;  Laterality: Bilateral;   WISDOM TOOTH EXTRACTION      Family History  Problem Relation Age of Onset   Drug abuse Mother    COPD Mother    Mental retardation Mother    Early death Mother 9       blood clot   Ovarian cancer Mother 28       dx 'late 2022-08-10', died at 41 due to COPD   Heart disease Mother    Breast cancer Paternal Aunt 31       is now in 76's   Alcohol abuse Sister    Heart disease Maternal Uncle        died late 56's   Heart attack Maternal Grandmother 52    Social History   Tobacco Use   Smoking status: Never   Smokeless tobacco: Never  Vaping Use   Vaping Use: Never used  Substance Use Topics   Alcohol use: Yes     Alcohol/week: 2.0 - 3.0 standard drinks of alcohol    Types: 2 - 3 Glasses of wine per week    Comment: socially   Drug use: No    ROS   Objective:   Vitals: BP 131/85 (BP Location: Left Arm)   Pulse 71   Temp 98.8 F (37.1 C) (Oral)   Resp 16   LMP 02/24/2019 (Approximate)   SpO2 97%   Physical Exam Constitutional:      General: She is not in acute distress.    Appearance: Normal appearance. She is well-developed. She is not ill-appearing, toxic-appearing or diaphoretic.  HENT:     Head: Normocephalic and atraumatic.     Nose: Nose normal.     Mouth/Throat:     Mouth: Mucous membranes are moist.  Eyes:     General: No scleral icterus.       Right eye: No discharge.        Left eye: No discharge.     Extraocular Movements: Extraocular movements intact.     Conjunctiva/sclera: Conjunctivae normal.  Cardiovascular:     Rate and Rhythm: Normal rate.  Pulmonary:     Effort: Pulmonary effort is normal.  Abdominal:     General: Bowel sounds are normal. There is no distension.     Palpations: Abdomen is soft. There is no mass.     Tenderness: There is no abdominal tenderness. There is no right CVA tenderness, left CVA tenderness, guarding or rebound.  Skin:    General: Skin is warm and dry.  Neurological:     General: No focal deficit present.     Mental Status: She is alert and oriented to person, place, and time.  Psychiatric:        Mood and Affect: Mood normal.        Behavior: Behavior normal.        Thought Content: Thought content normal.        Judgment: Judgment normal.    Results for orders placed or performed during the hospital encounter of 07/07/22 (from the past 24 hour(s))  POCT urinalysis dipstick     Status: Abnormal   Collection Time: 07/07/22  1:18 PM  Result Value Ref Range   Color, UA yellow yellow   Clarity, UA clear clear   Glucose, UA negative negative mg/dL   Bilirubin, UA negative negative   Ketones,  POC UA negative negative mg/dL    Spec Grav, UA 1.010 1.010 - 1.025   Blood, UA trace-intact (A) negative   pH, UA 7.0 5.0 - 8.0   Protein Ur, POC negative negative mg/dL   Urobilinogen, UA 0.2 0.2 or 1.0 E.U./dL   Nitrite, UA Negative Negative   Leukocytes, UA Trace (A) Negative   Assessment and Plan :   PDMP not reviewed this encounter.  1. Acute vaginitis   2. Suprapubic pain   3. Vaginal discharge     We will treat patient empirically for bacterial vaginosis with Flagyl and for yeast vaginitis with fluconazole.  Labs pending.  I did recommend she follow-up with her gynecologist as well to revisit whether or not she should have hormone replacement therapy.  Counseled patient on potential for adverse effects with medications prescribed/recommended today, ER and return-to-clinic precautions discussed, patient verbalized understanding.    Jaynee Eagles, [place] 07/07/22 1341

## 2022-07-07 NOTE — ED Triage Notes (Signed)
Pt c/o vaginal d/c, lower back pain and abd cramps x 4 days-NAD-steady gait

## 2022-07-09 LAB — URINE CULTURE: Culture: NO GROWTH

## 2022-07-10 LAB — CERVICOVAGINAL ANCILLARY ONLY
Bacterial Vaginitis (gardnerella): NEGATIVE
Candida Glabrata: NEGATIVE
Candida Vaginitis: NEGATIVE
Comment: NEGATIVE
Comment: NEGATIVE
Comment: NEGATIVE

## 2022-08-10 ENCOUNTER — Encounter: Payer: Self-pay | Admitting: Obstetrics and Gynecology

## 2022-08-10 ENCOUNTER — Ambulatory Visit: Payer: BLUE CROSS/BLUE SHIELD | Admitting: Obstetrics and Gynecology

## 2022-08-10 VITALS — BP 112/68 | HR 83 | Wt 143.0 lb

## 2022-08-10 DIAGNOSIS — Z8742 Personal history of other diseases of the female genital tract: Secondary | ICD-10-CM

## 2022-08-10 DIAGNOSIS — N941 Unspecified dyspareunia: Secondary | ICD-10-CM

## 2022-08-10 DIAGNOSIS — N952 Postmenopausal atrophic vaginitis: Secondary | ICD-10-CM | POA: Diagnosis not present

## 2022-08-10 DIAGNOSIS — R1030 Lower abdominal pain, unspecified: Secondary | ICD-10-CM | POA: Diagnosis not present

## 2022-08-10 LAB — WET PREP FOR TRICH, YEAST, CLUE

## 2022-08-10 MED ORDER — ESTRADIOL 10 MCG VA TABS: ORAL_TABLET | VAGINAL | 1 refills | Status: DC

## 2022-08-10 NOTE — Progress Notes (Signed)
GYNECOLOGY  VISIT   HPI: 57 y.o.   Married White or Caucasian Not Hispanic or Latino  female   [id number] with Patient's last menstrual period was 02/24/2019 (approximate).   here to discuss changes since surgery. In 923 she underwent a robotic-assisted laparoscopic BSO for an adnexal mass and FH of ovarian cancer. Pathology was benign. She has been menopausal for several years.  She wasn't sexually active for a few months after surgery. When she tried being active she was very dry and it was painful. Some help with lubricant.  About a month ago she developed d/c a few days after intercourse, along with cramps. She was seen in urgent care, and was diagnosed with a vaginitis and was given flagyl and diflucan. She still has occasional lower abdominal pain.  She feels her vagina is dryer since the surgery.   No vaginal bleeding.   GYNECOLOGIC HISTORY: Patient's last menstrual period was 02/24/2019 (approximate). 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 11/10/2021   Mixed hyperlipidemia 06/24/2019   FH: heart disease 06/24/2019   Seborrheic dermatitis of scalp 03/25/2019   Genetic testing 11/08/2016   Family history of ovarian cancer    Endometrioma of ovary 09/08/2016   High risk of ovarian cancer 08/07/2016   Encounter for preventive health examination 09/07/2015   Skin lesion of breast 09/07/2015    Past Medical History:  Diagnosis Date   Adnexal mass    Family history of ovarian cancer    Ovarian cyst    Pneumonia due to COVID-19 virus 05/21/2019    Past Surgical History:  Procedure Laterality Date   ADENOIDECTOMY     ROBOTIC ASSISTED BILATERAL SALPINGO OOPHERECTOMY  Bilateral 01/04/2022   Procedure: XI ROBOTIC ASSISTED BILATERAL SALPINGO OOPHORECTOMY;  Surgeon: Carver Fila, MD;  Location: [facility];  Service: Gynecology;  Laterality: Bilateral;   WISDOM TOOTH EXTRACTION      Current Outpatient Medications  Medication Sig Dispense Refill   ALOE VERA JUICE PO Take 118 mLs by mouth daily.     APPLE CIDER VINEGAR PO Take 15 mLs by mouth daily.     b complex vitamins capsule Take 1 capsule by mouth daily.     Bromelains (BROMELAIN PO) Take 140 mg by mouth daily.     Cholecalciferol (VITAMIN D) 125 MCG (5000 UT) CAPS Take 5,000 Units by mouth daily.     Ginger, Zingiber officinalis, (GINGER PO) Take 150 mg by mouth daily.     Lactobacillus (PROBIOTIC ACIDOPHILUS) CAPS Take 1 capsule by mouth daily.     Magnesium 200 MG TABS Take 200 mg by mouth daily.     Multiple Vitamin (MULTIVITAMIN) tablet Take 1 tablet by mouth daily.     OIL OF OREGANO PO Take 230 mg by mouth daily.     Omega-3 Fatty Acids (OMEGA 3 PO) Take 292 mg by mouth daily.     OVER  THE COUNTER MEDICATION Take 500 mg by mouth daily. Bacopa     OVER THE COUNTER MEDICATION Take 2 capsules by mouth daily. Adrenal Health - 520 mg per capsule     OVER THE COUNTER MEDICATION Take 36 mg by mouth daily. Curcuminoids     OVER THE COUNTER MEDICATION Take 450 mg by mouth daily. Reishi Mushroom     OVER THE COUNTER MEDICATION Take 1 capsule by mouth daily. Estrotone: Evening Primrose Oil - 200 mg, Schizandra - 160 mg, Ginger - 50 mg, Black Cohosh - 40 mg, Chaste Tree - 40 mg, Rosemary - 10 mg     Potassium 99 MG TABS Take 99 mg by mouth daily.     Quercetin 50 MG TABS Take 50 mg by mouth daily.     selenium 200 MCG TABS tablet Take 200 mcg by mouth daily.     Spirulina 500 MG TABS Take 500 mg by mouth daily.     TURMERIC PO Take 482 mg by mouth daily.     vitamin C (ASCORBIC ACID) 250 MG tablet Take 250 mg by mouth daily.     Zinc 30 MG CAPS Take 30 mg by mouth daily.     No current  facility-administered medications for this visit.     ALLERGIES: Patient has no known allergies.  Family History  Problem Relation Age of Onset   Drug abuse Mother    COPD Mother    Mental retardation Mother    Early death Mother 33       blood clot   Ovarian cancer Mother 18       dx 'late Sep 08, 2022', died at 12 due to COPD   Heart disease Mother    Breast cancer Paternal Aunt 79       is now in 33's   Alcohol abuse Sister    Heart disease Maternal Uncle        died late 89's   Heart attack Maternal Grandmother 60    Social History   Socioeconomic History   Marital status: Married    Spouse name: Not on file   Number of children: Not on file   Years of education: Not on file   Highest education level: Not on file  Occupational History   Occupation: financial controller  Tobacco Use   Smoking status: Never   Smokeless tobacco: Never  Vaping Use   Vaping Use: Never used  Substance and Sexual Activity   Alcohol use: Yes    Alcohol/week: 2.0 - 3.0 standard drinks of alcohol    Types: 2 - 3 Glasses of wine per week    Comment: socially   Drug use: No   Sexual activity: Yes    Partners: Male    Birth control/protection: Other-see comments    Comment: Husband had a vasectomy  Other Topics Concern   Not on file  Social History Narrative   ** Merged History Encounter **       Husband, Huel Cote. No children. 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  Smoke detector in the home. Wears her set belt. Feels safe in her relationships.     Social Determinants of Health   Financial Resource Strain: Not on file  Food Insecurity: Not on file  Transportation Needs: Not on file  Physical Activity: Not on file  Stress: Not on file  Social Connections: Not on file  Intimate Partner Violence: Not on file    Review of Systems  All other systems reviewed  and are negative.   PHYSICAL EXAMINATION:    BP 112/68   Pulse 83   Wt 143 lb (64.9 kg)   LMP  02/24/2019 (Approximate)   SpO2 99%   BMI 23.80 kg/m     General appearance: alert, cooperative and appears stated age Abdomen: soft, non-tender; non distended, no masses,  no organomegaly  Pelvic: External genitalia:  no lesions              Urethra:  normal appearing urethra with no masses, tenderness or lesions              Bartholins and Skenes: normal                 Vagina: normal appearing vagina with normal color and discharge, no lesions              Cervix: no cervical motion tenderness and no lesions              Bimanual Exam:  Uterus:  normal size, contour, position, consistency, mobility, non-tender              Adnexa: no mass, fullness, tenderness               Chaperone was present for exam.  1. Dyspareunia, female Suspect from atrophy, but recent vaginitis. Will r/o vaginitis - WET PREP FOR TRICH, YEAST, CLUE: neg - SureSwab Advanced Vaginitis, TMA  2. History of vaginitis - WET PREP FOR TRICH, YEAST, CLUE: neg - SureSwab Advanced Vaginitis, TMA  3. Vaginal atrophy Samples of uberlube given No contraindications to vaginal estrogen - Estradiol 10 MCG TABS vaginal tablet; Place one tablet vaginally qhs x 1 week, then change to 2 x a week at hs.  Dispense: 24 tablet; Refill: 1  4. Lower abdominal pain Normal exam, only occasional pain. Call with concerns.

## 2022-08-11 LAB — SURESWAB® ADVANCED VAGINITIS,TMA
CANDIDA SPECIES: NOT DETECTED
Candida glabrata: NOT DETECTED
SURESWAB(R) ADV BACTERIAL VAGINOSIS(BV),TMA: NEGATIVE
TRICHOMONAS VAGINALIS (TV),TMA: NOT DETECTED

## 2022-11-13 ENCOUNTER — Ambulatory Visit (INDEPENDENT_AMBULATORY_CARE_PROVIDER_SITE_OTHER): Payer: BLUE CROSS/BLUE SHIELD | Admitting: Family Medicine

## 2022-11-13 ENCOUNTER — Encounter: Payer: Self-pay | Admitting: Family Medicine

## 2022-11-13 VITALS — BP 124/81 | HR 60 | Ht 67.0 in | Wt 144.4 lb

## 2022-11-13 DIAGNOSIS — E782 Mixed hyperlipidemia: Secondary | ICD-10-CM

## 2022-11-13 DIAGNOSIS — Z8249 Family history of ischemic heart disease and other diseases of the circulatory system: Secondary | ICD-10-CM

## 2022-11-13 DIAGNOSIS — Z1231 Encounter for screening mammogram for malignant neoplasm of breast: Secondary | ICD-10-CM

## 2022-11-13 DIAGNOSIS — Z131 Encounter for screening for diabetes mellitus: Secondary | ICD-10-CM

## 2022-11-13 DIAGNOSIS — Z0000 Encounter for general adult medical examination without abnormal findings: Secondary | ICD-10-CM | POA: Diagnosis not present

## 2022-11-13 LAB — CBC WITH DIFFERENTIAL/PLATELET
Basophils Absolute: 0 10*3/uL (ref 0.0–0.1)
Basophils Relative: 0.8 % (ref 0.0–3.0)
Eosinophils Absolute: 0 10*3/uL (ref 0.0–0.7)
Eosinophils Relative: 1 % (ref 0.0–5.0)
HCT: 40.5 % (ref 36.0–46.0)
Hemoglobin: 13 g/dL (ref 12.0–15.0)
Lymphocytes Relative: 39.9 % (ref 12.0–46.0)
Lymphs Abs: 1.5 10*3/uL (ref 0.7–4.0)
MCHC: 32.2 g/dL (ref 30.0–36.0)
MCV: 95.6 fl (ref 78.0–100.0)
Monocytes Absolute: 0.2 10*3/uL (ref 0.1–1.0)
Monocytes Relative: 6.5 % (ref 3.0–12.0)
Neutro Abs: 1.9 10*3/uL (ref 1.4–7.7)
Neutrophils Relative %: 51.8 % (ref 43.0–77.0)
Platelets: 232 10*3/uL (ref 150.0–400.0)
RBC: 4.23 Mil/uL (ref 3.87–5.11)
RDW: 14.3 % (ref 11.5–15.5)
WBC: 3.7 10*3/uL — ABNORMAL LOW (ref 4.0–10.5)

## 2022-11-13 LAB — COMPREHENSIVE METABOLIC PANEL
ALT: 12 U/L (ref 0–35)
AST: 18 U/L (ref 0–37)
Albumin: 4.5 g/dL (ref 3.5–5.2)
Alkaline Phosphatase: 54 U/L (ref 39–117)
BUN: 16 mg/dL (ref 6–23)
CO2: 26 mEq/L (ref 19–32)
Calcium: 9.9 mg/dL (ref 8.4–10.5)
Chloride: 103 mEq/L (ref 96–112)
Creatinine, Ser: 0.61 mg/dL (ref 0.40–1.20)
GFR: 100.46 mL/min (ref >60.00)
Glucose, Bld: 96 mg/dL (ref 70–99)
Potassium: 4.4 mEq/L (ref 3.5–5.1)
Sodium: 140 mEq/L (ref 135–145)
Total Bilirubin: 0.9 mg/dL (ref 0.2–1.2)
Total Protein: 6.9 g/dL (ref 6.0–8.3)

## 2022-11-13 LAB — HEMOGLOBIN A1C: Hgb A1c MFr Bld: 5.6 % (ref 4.6–6.5)

## 2022-11-13 LAB — LIPID PANEL
Cholesterol: 238 mg/dL — ABNORMAL HIGH (ref 0–200)
HDL: 73.5 mg/dL (ref >39.00)
LDL Cholesterol: 149 mg/dL — ABNORMAL HIGH (ref 0–99)
NonHDL: 164.88
Total CHOL/HDL Ratio: 3
Triglycerides: 79 mg/dL (ref 0.0–149.0)
VLDL: 15.8 mg/dL (ref 0.0–40.0)

## 2022-11-13 LAB — TSH: TSH: 3.25 u[IU]/mL (ref 0.35–5.50)

## 2022-11-13 NOTE — Patient Instructions (Addendum)
Return in about 1 year (around 11/14/2023) for cpe (20 min).        Great to see you today.  I have refilled the medication(s) we provide.   If labs were collected, we will inform you of lab results once received either by echart message or telephone call.   - echart message- for normal results that have been seen by the patient already.   - telephone call: abnormal results or if patient has not viewed results in their echart.

## 2022-11-13 NOTE — Progress Notes (Signed)
Patient ID: Mary Warren, female  DOB: 10/17/1965, 57 y.o.   MRN: [id number] Patient Care Team    Relationship Specialty Notifications Start End  Natalia Leatherwood, DO PCP - General Family Medicine  10/26/16   Romualdo Bolk, MD (Inactive) Consulting Physician Obstetrics and Gynecology  03/25/19     Chief Complaint  Patient presents with   Annual Exam    Fasting CPE. Declined tdap today.    Subjective:  Mary Warren is a 57 y.o.  Female  present for CPE  All past medical history, surgical history, allergies, family history, immunizations, medications and social history were updated in the electronic medical record today. All recent labs, ED visits and hospitalizations within the last year were reviewed.  Health maintenance:  Colon cancer screen: No FHX, cologuard completed 11/23/2021- neg Mammogram: FH in Pat aunt Dec 20, 2058), Ovarian cancer (<30), pts  mammogram normal 02/08/2022 > ordered at [facility] Cervical cancer screening: Ovarian cancer in mother. Pap smear utd1 w/ GYN- Dr. Oscar La Immunizations: td due declined, flu vaccination (encouraged yearly), shingrix declined Infectious disease screening: HIV completed Dec 20, 2010, Hep C completed DEXA: routine screen Assistive device: none Oxygen ONG:EXBM Patient has a Dental home. Hospitalizations/ED visits: reviewed     11/10/2021    8:07 AM 03/25/2019    8:09 AM 08/07/2016    8:31 AM 09/07/2015    8:08 AM  Depression screen PHQ 2/9  Decreased Interest 0 0 0 0  Down, Depressed, Hopeless 0 0 0 0  PHQ - 2 Score 0 0 0 0       No data to display          There is no immunization history on file for this patient.  Past Medical History:  Diagnosis Date   Adnexal mass    Family history of ovarian cancer    Ovarian cyst    Pneumonia due to COVID-19 virus 05/21/2019   No Known Allergies Past Surgical History:  Procedure Laterality Date   ADENOIDECTOMY     ROBOTIC ASSISTED BILATERAL SALPINGO OOPHERECTOMY Bilateral 01/04/2022    Procedure: XI ROBOTIC ASSISTED BILATERAL SALPINGO OOPHORECTOMY;  Surgeon: Carver Fila, MD;  Location: [facility];  Service: Gynecology;  Laterality: Bilateral;   WISDOM TOOTH EXTRACTION     Family History  Problem Relation Age of Onset   Drug abuse Mother    COPD Mother    Mental retardation Mother    Early death Mother 66       blood clot   Ovarian cancer Mother 5       dx 'late 12-20-22', died at 28 due to COPD   Heart disease Mother    Breast cancer Paternal Aunt 18       is now in 69's   Alcohol abuse Sister    Heart disease Maternal Uncle        died late 33's   Heart attack Maternal Grandmother 29   Social History   Social History Narrative   ** Merged History Encounter **       Husband, 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  Smoke detector in the home. Wears her set belt. Feels safe in her relationships.      Allergies as of 11/13/2022   No Known Allergies      Medication List        Accurate as of November 13, 2022  8:31 AM. If you have any questions, ask your nurse or doctor.  ALOE VERA JUICE PO Take 118 mLs by mouth daily.   APPLE CIDER VINEGAR PO Take 15 mLs by mouth daily.   b complex vitamins capsule Take 1 capsule by mouth daily.   BROMELAIN PO Take 140 mg by mouth daily.   Estradiol 10 MCG Tabs vaginal tablet Place one tablet vaginally qhs x 1 week, then change to 2 x a week at hs.   GINGER PO Take 150 mg by mouth daily.   Magnesium 200 MG Tabs Take 200 mg by mouth daily.   multivitamin tablet Take 1 tablet by mouth daily.   OIL OF OREGANO PO Take 230 mg by mouth daily.   OMEGA 3 PO Take 292 mg by mouth daily.   OVER THE COUNTER MEDICATION Take 500 mg by mouth daily. Bacopa   OVER THE COUNTER MEDICATION Take 2 capsules by mouth daily. Adrenal Health - 520 mg per capsule   OVER THE COUNTER MEDICATION Take 36 mg by mouth daily. Curcuminoids   OVER THE COUNTER  MEDICATION Take 450 mg by mouth daily. Reishi Mushroom   OVER THE COUNTER MEDICATION Take 1 capsule by mouth daily. Estrotone: Evening Primrose Oil - 200 mg, Schizandra - 160 mg, Ginger - 50 mg, Black Cohosh - 40 mg, Chaste Tree - 40 mg, Rosemary - 10 mg   Potassium 99 MG Tabs Take 99 mg by mouth daily.   Probiotic Acidophilus Caps Take 1 capsule by mouth daily.   Quercetin 50 MG Tabs Take 50 mg by mouth daily.   selenium 200 MCG Tabs tablet Take 200 mcg by mouth daily.   Spirulina 500 MG Tabs Take 500 mg by mouth daily.   TURMERIC PO Take 482 mg by mouth daily.   vitamin C 250 MG tablet Commonly known as: ASCORBIC ACID Take 250 mg by mouth daily.   Vitamin D 125 MCG (5000 UT) Caps Take 5,000 Units by mouth daily.   Zinc 30 MG Caps Take 30 mg by mouth daily.        All past medical history, surgical history, allergies, family history, immunizations andmedications were updated in the EMR today and reviewed under the history and medication portions of their EMR.     No results found for this or any previous visit (from the past 2160 hour(s)).  No results found.   Review of Systems  All other systems reviewed and are negative.  14 pt review of systems performed and negative (unless mentioned in an HPI)  Objective: BP 124/81   Pulse 60   Ht 5\' 7"  (1.702 m)   Wt 144 lb 6.4 oz (65.5 kg)   LMP 02/24/2019 (Approximate)   SpO2 97%   BMI 22.62 kg/m  Physical Exam Vitals and nursing note reviewed.  Constitutional:      General: She is not in acute distress.    Appearance: Normal appearance. She is not ill-appearing or toxic-appearing.  HENT:     Head: Normocephalic and atraumatic.     Right Ear: Tympanic membrane, ear canal and external ear normal. There is no impacted cerumen.     Left Ear: Tympanic membrane, ear canal and external ear normal. There is no impacted cerumen.     Nose: No congestion or rhinorrhea.     Mouth/Throat:     Mouth: Mucous membranes  are moist.     Pharynx: Oropharynx is clear. No oropharyngeal exudate or posterior oropharyngeal erythema.  Eyes:     General: No scleral icterus.       Right  eye: No discharge.        Left eye: No discharge.     Extraocular Movements: Extraocular movements intact.     Conjunctiva/sclera: Conjunctivae normal.     Pupils: Pupils are equal, round, and reactive to light.  Cardiovascular:     Rate and Rhythm: Normal rate and regular rhythm.     Pulses: Normal pulses.     Heart sounds: Normal heart sounds. No murmur heard.    No friction rub. No gallop.  Pulmonary:     Effort: Pulmonary effort is normal. No respiratory distress.     Breath sounds: Normal breath sounds. No stridor. No wheezing, rhonchi or rales.  Chest:     Chest wall: No tenderness.  Abdominal:     General: Abdomen is flat. Bowel sounds are normal. There is no distension.     Palpations: Abdomen is soft. There is no mass.     Tenderness: There is no abdominal tenderness. There is no right CVA tenderness, left CVA tenderness, guarding or rebound.     Hernia: No hernia is present.  Musculoskeletal:        General: No swelling, tenderness or deformity. Normal range of motion.     Cervical back: Normal range of motion and neck supple. No rigidity or tenderness.     Right lower leg: No edema.     Left lower leg: No edema.  Lymphadenopathy:     Cervical: No cervical adenopathy.  Skin:    General: Skin is warm and dry.     Coloration: Skin is not jaundiced or pale.     Findings: No bruising, erythema, lesion or rash.  Neurological:     General: No focal deficit present.     Mental Status: She is alert and oriented to person, place, and time. Mental status is at baseline.     Cranial Nerves: No cranial nerve deficit.     Sensory: No sensory deficit.     Motor: No weakness.     Coordination: Coordination normal.     Gait: Gait normal.     Deep Tendon Reflexes: Reflexes normal.  Psychiatric:        Mood and Affect: Mood  normal.        Behavior: Behavior normal.        Thought Content: Thought content normal.        Judgment: Judgment normal.      No results found.  Assessment/plan: Mary Warren is a 57 y.o. female present for CPE  Encounter for preventive health examination Patient was encouraged to exercise greater than 150 minutes a week. Patient was encouraged to choose a diet filled with fresh fruits and vegetables, and lean meats. AVS provided to patient today for education/recommendation on gender specific health and safety maintenance. Colon cancer screen: No FHX, cologuard completed 11/23/2021- neg Mammogram: FH in Pat aunt (60), Ovarian cancer (<30), pts  mammogram normal 02/08/2022 > ordered at [facility] Cervical cancer screening: Ovarian cancer in mother. Pap smear utd1 w/ GYN- Dr. Oscar La Immunizations: td due declined, flu vaccination (encouraged yearly), shingrix declined Infectious disease screening: HIV completed 2012, Hep C completed DEXA: routine screen  Return in about 1 year (around 11/14/2023) for cpe (20 min).    Orders Placed This Encounter  Procedures   MM 3D SCREENING MAMMOGRAM BILATERAL BREAST   CBC with Differential/Platelet   Hemoglobin A1c   Comprehensive metabolic panel   Lipid panel   TSH   No orders of the defined types were placed in  this encounter.  Referral Orders  No referral(s) requested today     Electronically signed by: Felix Pacini, DO [facility]

## 2022-12-19 ENCOUNTER — Encounter: Payer: Self-pay | Admitting: Family Medicine

## 2022-12-19 ENCOUNTER — Ambulatory Visit (INDEPENDENT_AMBULATORY_CARE_PROVIDER_SITE_OTHER): Payer: BLUE CROSS/BLUE SHIELD | Admitting: Family Medicine

## 2022-12-19 ENCOUNTER — Other Ambulatory Visit (HOSPITAL_BASED_OUTPATIENT_CLINIC_OR_DEPARTMENT_OTHER): Payer: Self-pay

## 2022-12-19 VITALS — BP 128/60 | HR 70 | Temp 98.0°F | Ht 67.0 in | Wt 144.0 lb

## 2022-12-19 DIAGNOSIS — R1030 Lower abdominal pain, unspecified: Secondary | ICD-10-CM | POA: Diagnosis not present

## 2022-12-19 DIAGNOSIS — R3129 Other microscopic hematuria: Secondary | ICD-10-CM | POA: Diagnosis not present

## 2022-12-19 DIAGNOSIS — M545 Low back pain, unspecified: Secondary | ICD-10-CM

## 2022-12-19 LAB — POC URINALSYSI DIPSTICK (AUTOMATED)
Glucose, UA: NEGATIVE
Ketones, UA: NEGATIVE
Nitrite, UA: POSITIVE
Protein, UA: NEGATIVE
Spec Grav, UA: 1.015 (ref 1.010–1.025)
Urobilinogen, UA: 0.2 E.U./dL
pH, UA: 5 (ref 5.0–8.0)

## 2022-12-19 MED ORDER — SULFAMETHOXAZOLE-TRIMETHOPRIM 800-160 MG PO TABS
1.0000 | ORAL_TABLET | Freq: Two times a day (BID) | ORAL | 0 refills | Status: AC
  Filled 2022-12-19: qty 6, 3d supply, fill #0

## 2022-12-19 MED ORDER — SULFAMETHOXAZOLE-TRIMETHOPRIM 800-160 MG PO TABS: 1.0000 | ORAL_TABLET | Freq: Two times a day (BID) | ORAL | 0 refills | Status: DC

## 2022-12-19 NOTE — Patient Instructions (Signed)
Suspect UTI Starting Bactrim, will adjust based on the culture if needed. Will take about 2 days to result Increase fluid intake and try cranberry supplement, Azo, etc.  Recommend recheck urine 2-3 weeks after finishing antibiotics to ensure blood is gone

## 2022-12-19 NOTE — Progress Notes (Signed)
Acute Office Visit  Subjective:     Patient ID: Mary Warren, female    DOB: Dec 12, 1965, 57 y.o.   MRN: [id number]  Chief Complaint  Patient presents with   Back Pain   Groin Pain     Patient is in today for flank pain.   Discussed the use of AI scribe software for clinical note transcription with the patient, who gave verbal consent to proceed.  History of Present Illness   The patient presents with a week-long history of flank pain and urinary symptoms. The symptoms began after a visit to a public spa where she swam and received a massage. Initially, the discomfort was localized to the right groin area, which she attributed to muscle tension. However, the pain seemed to migrate to the lower back and was associated with occasional urinary urgency. The discomfort was particularly bothersome at night, to the point of disrupting sleep, but did not reach severe levels, rating it a 4-5 out of 10.  The patient attempted self-management with ibuprofen and Azo, with limited relief. She also experienced a stomach bug over the weekend, but believes it to be unrelated to the urinary symptoms. The patient denies any fevers, chills, nausea, vomiting, or diarrhea associated with the urinary symptoms. She also denies any burning sensation during urination, but reports occasional discomfort.  The patient has been taking cranberry supplements and has a history of urinary tract changes since entering post-menopause. She denies any recent antibiotic use or history of resistant urinary tract infections.           All review of systems negative except what is listed in the HPI      Objective:    BP 128/60   Pulse 70   Temp 98 F (36.7 C) (Oral)   Ht 5\' 7"  (1.702 m)   Wt 144 lb (65.3 kg)   LMP 02/24/2019 (Approximate)   SpO2 98%   BMI 22.55 kg/m    Physical Exam Vitals reviewed.  Constitutional:      Appearance: Normal appearance.  Cardiovascular:     Rate and Rhythm: Normal rate  and regular rhythm.     Heart sounds: Normal heart sounds.  Pulmonary:     Effort: Pulmonary effort is normal.     Breath sounds: Normal breath sounds.  Abdominal:     Tenderness: There is no abdominal tenderness. There is no right CVA tenderness, left CVA tenderness or guarding.  Skin:    General: Skin is warm and dry.  Neurological:     Mental Status: She is alert and oriented to person, place, and time.  Psychiatric:        Mood and Affect: Mood normal.        Behavior: Behavior normal.        Thought Content: Thought content normal.        Judgment: Judgment normal.     Results for orders placed or performed in visit on 12/19/22  POCT Urinalysis Dipstick (Automated)  Result Value Ref Range   Color, UA yellow    Clarity, UA clear    Glucose, UA Negative Negative   Bilirubin, UA moderate    Ketones, UA negative    Spec Grav, UA 1.015 1.010 - 1.025   Blood, UA trace    pH, UA 5.0 5.0 - 8.0   Protein, UA Negative Negative   Urobilinogen, UA 0.2 0.2 or 1.0 E.U./dL   Nitrite, UA positive    Leukocytes, UA Small (1+) (A) Negative  Assessment & Plan:   Problem List Items Addressed This Visit   None Visit Diagnoses     Acute bilateral low back pain without sciatica    -  Primary   Relevant Medications   sulfamethoxazole-trimethoprim (BACTRIM DS) 800-160 MG tablet   Other Relevant Orders   POCT Urinalysis Dipstick (Automated) (Completed)   Urine Culture   Inguinal pain, unspecified laterality       Relevant Medications   sulfamethoxazole-trimethoprim (BACTRIM DS) 800-160 MG tablet   Other Relevant Orders   POCT Urinalysis Dipstick (Automated) (Completed)   Urine Culture   Other microscopic hematuria       Relevant Orders   Urinalysis, Routine w reflex microscopic          Suspected Urinary Tract Infection (UTI) Symptoms of flank pain, urinary discomfort, and urgency for about a week. Urinalysis suggestive of UTI with leukocytes, nitrites, and trace  blood. Concurrent recent gastrointestinal illness, but symptoms of UTI started prior to this. -Start Bactrim for UTI. -Send urine for culture and sensitivity. -Check urine in 2-3 weeks after completion of antibiotics to ensure resolution of hematuria. -Advise to continue cranberry supplements, hydration, and symptomatic relief with Azo as needed.        Meds ordered this encounter  Medications   DISCONTD: sulfamethoxazole-trimethoprim (BACTRIM DS) 800-160 MG tablet    Sig: Take 1 tablet by mouth 2 (two) times daily for 3 days.    Dispense:  6 tablet    Refill:  0    Order Specific Question:   Supervising Provider    Answer:   Danise Edge A [[id number]]   sulfamethoxazole-trimethoprim (BACTRIM DS) 800-160 MG tablet    Sig: Take 1 tablet by mouth 2 (two) times daily for 3 days.    Dispense:  6 tablet    Refill:  0    Order Specific Question:   Supervising Provider    Answer:   Danise Edge A [[id number]]    Return if symptoms worsen or fail to improve, for (repeat UA/micro 2-3 weeks after finishing ABX).  Clayborne Dana, NP

## 2022-12-21 LAB — URINE CULTURE
MICRO NUMBER:: 15387381
SPECIMEN QUALITY:: ADEQUATE

## 2023-01-02 ENCOUNTER — Encounter: Payer: Self-pay | Admitting: Radiology

## 2023-01-02 ENCOUNTER — Ambulatory Visit (INDEPENDENT_AMBULATORY_CARE_PROVIDER_SITE_OTHER): Payer: BLUE CROSS/BLUE SHIELD | Admitting: Radiology

## 2023-01-02 VITALS — BP 110/62 | HR 62 | Resp 14 | Ht 66.0 in | Wt 143.0 lb

## 2023-01-02 DIAGNOSIS — R102 Pelvic and perineal pain: Secondary | ICD-10-CM

## 2023-01-02 NOTE — Progress Notes (Signed)
SUBJECTIVE: Mary Warren is a 57 y.o. female h/o BSO 12/2021 has done well since. She was treated for UTI on 12/19/22 by PCP- completed antibiotics. Feels that has resolved. Pelvic pain, left sided, waking her at night-- feels like it has been much better the past week, just wants to have checked out. Had a massage and after felt like her hips were tighter, stretching has improved the pain.  No Known Allergies  Current Outpatient Medications on File Prior to Visit  Medication Sig Dispense Refill   ALOE VERA JUICE PO Take 118 mLs by mouth daily.     APPLE CIDER VINEGAR PO Take 15 mLs by mouth daily.     b complex vitamins capsule Take 1 capsule by mouth daily.     Bromelains (BROMELAIN PO) Take 140 mg by mouth daily.     Cholecalciferol (VITAMIN D) 125 MCG (5000 UT) CAPS Take 5,000 Units by mouth daily.     Ginger, Zingiber officinalis, (GINGER PO) Take 150 mg by mouth daily.     Lactobacillus (PROBIOTIC ACIDOPHILUS) CAPS Take 1 capsule by mouth daily.     Magnesium 200 MG TABS Take 200 mg by mouth daily.     Multiple Vitamin (MULTIVITAMIN) tablet Take 1 tablet by mouth daily.     OIL OF OREGANO PO Take 230 mg by mouth daily.     Omega-3 Fatty Acids (OMEGA 3 PO) Take 292 mg by mouth daily.     OVER THE COUNTER MEDICATION Take 500 mg by mouth daily. Bacopa     OVER THE COUNTER MEDICATION Take 2 capsules by mouth daily. Adrenal Health - 520 mg per capsule     OVER THE COUNTER MEDICATION Take 36 mg by mouth daily. Curcuminoids     OVER THE COUNTER MEDICATION Take 450 mg by mouth daily. Reishi Mushroom     OVER THE COUNTER MEDICATION Take 1 capsule by mouth daily. Estrotone: Evening Primrose Oil - 200 mg, Schizandra - 160 mg, Ginger - 50 mg, Black Cohosh - 40 mg, Chaste Tree - 40 mg, Rosemary - 10 mg     Potassium 99 MG TABS Take 99 mg by mouth daily.     Quercetin 50 MG TABS Take 50 mg by mouth daily.     selenium 200 MCG TABS tablet Take 200 mcg by mouth daily.     Spirulina 500 MG TABS  Take 500 mg by mouth daily.     TURMERIC PO Take 482 mg by mouth daily.     vitamin C (ASCORBIC ACID) 250 MG tablet Take 250 mg by mouth daily.     Zinc 30 MG CAPS Take 30 mg by mouth daily.     Estradiol 10 MCG TABS vaginal tablet Place one tablet vaginally qhs x 1 week, then change to 2 x a week at hs. (Patient not taking: Reported on 01/02/2023) 24 tablet 1   No current facility-administered medications on file prior to visit.    Past Medical History:  Diagnosis Date   Adnexal mass    Family history of ovarian cancer    Ovarian cyst    Pneumonia due to COVID-19 virus 05/21/2019     OBJECTIVE: Appears well, in no apparent distress.  Vital signs are normal. The abdomen is soft without tenderness, guarding, mass, rebound or organomegaly. No CVA tenderness or inguinal adenopathy noted. Urine dipstick shows positive for leukocytes.  Micro exam: 0-5 WBC's per HPF, 0-2 RBC's per HPF, and few+ bacteria.  Blood pressure 110/62, pulse 62, resp. rate 14, height 5\' 6"  (1.676 m), weight 143 lb (64.9 kg), last menstrual period 02/24/2019.    Chaperone offered and declined for exam.  ASSESSMENT/PLAN: 1. Pelvic pain Reassured UTI resolved, will send C+S to confirm May be related to her tight hip flexors, continue with stretching - Urinalysis,Complete w/RFL Culture    Will send urine culture and sensitivity. Consider starting vaginal estrogen she was previously prescribed for Uti prevention. Increase fluids, avoid bladder irritants. Call or return to clinic prn if these symptoms worsen or fail to improve as anticipated.   Arlie Solomons B WHNP-BC 8:44 AM 01/02/2023

## 2023-01-04 LAB — URINALYSIS, COMPLETE W/RFL CULTURE
Bilirubin Urine: NEGATIVE
Glucose, UA: NEGATIVE
Hgb urine dipstick: NEGATIVE
Hyaline Cast: NONE SEEN /LPF
Ketones, ur: NEGATIVE
Nitrites, Initial: NEGATIVE
Protein, ur: NEGATIVE
Specific Gravity, Urine: 1.01 (ref 1.001–1.035)
pH: 7 (ref 5.0–8.0)

## 2023-01-04 LAB — URINE CULTURE
MICRO NUMBER:: 15445455
SPECIMEN QUALITY:: ADEQUATE

## 2023-01-04 LAB — CULTURE INDICATED

## 2023-11-15 ENCOUNTER — Encounter: Payer: Self-pay | Admitting: Family Medicine

## 2023-11-15 ENCOUNTER — Ambulatory Visit (INDEPENDENT_AMBULATORY_CARE_PROVIDER_SITE_OTHER): Payer: BLUE CROSS/BLUE SHIELD | Admitting: Family Medicine

## 2023-11-15 VITALS — BP 120/80 | HR 67 | Temp 97.9°F | Ht 66.0 in | Wt 156.4 lb

## 2023-11-15 DIAGNOSIS — Z8249 Family history of ischemic heart disease and other diseases of the circulatory system: Secondary | ICD-10-CM | POA: Diagnosis not present

## 2023-11-15 DIAGNOSIS — Z13220 Encounter for screening for lipoid disorders: Secondary | ICD-10-CM | POA: Diagnosis not present

## 2023-11-15 DIAGNOSIS — Z0000 Encounter for general adult medical examination without abnormal findings: Secondary | ICD-10-CM | POA: Diagnosis not present

## 2023-11-15 DIAGNOSIS — Z131 Encounter for screening for diabetes mellitus: Secondary | ICD-10-CM | POA: Diagnosis not present

## 2023-11-15 DIAGNOSIS — E782 Mixed hyperlipidemia: Secondary | ICD-10-CM

## 2023-11-15 DIAGNOSIS — Z1211 Encounter for screening for malignant neoplasm of colon: Secondary | ICD-10-CM

## 2023-11-15 DIAGNOSIS — Z1231 Encounter for screening mammogram for malignant neoplasm of breast: Secondary | ICD-10-CM

## 2023-11-15 LAB — LIPID PANEL
Cholesterol: 232 mg/dL — ABNORMAL HIGH (ref 0–200)
HDL: 78.6 mg/dL (ref >39.00)
LDL Cholesterol: 143 mg/dL — ABNORMAL HIGH (ref 0–99)
NonHDL: 153.55
Total CHOL/HDL Ratio: 3
Triglycerides: 51 mg/dL (ref 0.0–149.0)
VLDL: 10.2 mg/dL (ref 0.0–40.0)

## 2023-11-15 LAB — COMPREHENSIVE METABOLIC PANEL WITH GFR
ALT: 17 U/L (ref 0–35)
AST: 20 U/L (ref 0–37)
Albumin: 4.8 g/dL (ref 3.5–5.2)
Alkaline Phosphatase: 47 U/L (ref 39–117)
BUN: 12 mg/dL (ref 6–23)
CO2: 30 meq/L (ref 19–32)
Calcium: 9.9 mg/dL (ref 8.4–10.5)
Chloride: 102 meq/L (ref 96–112)
Creatinine, Ser: 0.57 mg/dL (ref 0.40–1.20)
GFR: 101.39 mL/min (ref >60.00)
Glucose, Bld: 93 mg/dL (ref 70–99)
Potassium: 4.4 meq/L (ref 3.5–5.1)
Sodium: 139 meq/L (ref 135–145)
Total Bilirubin: 0.6 mg/dL (ref 0.2–1.2)
Total Protein: 7.2 g/dL (ref 6.0–8.3)

## 2023-11-15 LAB — CBC
HCT: 39.7 % (ref 36.0–46.0)
Hemoglobin: 13.1 g/dL (ref 12.0–15.0)
MCHC: 33.1 g/dL (ref 30.0–36.0)
MCV: 94.1 fl (ref 78.0–100.0)
Platelets: 250 K/uL (ref 150.0–400.0)
RBC: 4.22 Mil/uL (ref 3.87–5.11)
RDW: 13.4 % (ref 11.5–15.5)
WBC: 4.8 K/uL (ref 4.0–10.5)

## 2023-11-15 LAB — HEMOGLOBIN A1C: Hgb A1c MFr Bld: 5.7 % (ref 4.6–6.5)

## 2023-11-15 NOTE — Progress Notes (Signed)
 Patient ID: Mary Warren, female  DOB: January 02, 1966, 58 y.o.   MRN: [id number] Patient Care Team    Relationship Specialty Notifications Start End  Catherine Charlies LABOR, DO PCP - General Family Medicine  10/26/16   Jannis Kate Norris, MD Consulting Physician Obstetrics and Gynecology  03/25/19     Chief Complaint  Patient presents with   Annual Exam    Pt is fasting    Subjective:  Mary Warren is a 58 y.o.  Female  present for CPE  All past medical history, surgical history, allergies, family history, immunizations, medications and social history were updated in the electronic medical record today. All recent labs, ED visits and hospitalizations within the last year were reviewed.  Health maintenance:  Colon cancer screen: No FHX, cologuard completed 11/23/2021- neg- rpt 3 yrs Mammogram: FH in Pat aunt 12/21/58), Ovarian cancer (<30), pts  mammogram normal 02/08/2022 > ordered at [facility]> ordered again Cervical cancer screening: Ovarian cancer in mother. Pap smear utd1 w/ GYN- Dr. Jannis Immunizations: td due declined, flu vaccination (encouraged yearly), shingrix declined Infectious disease screening: HIV completed 12-21-2010, Hep C completed DEXA: routine screen at 60 Assistive device: none Oxygen [id number] Patient has a Dental home. Hospitalizations/ED visits: reviewed     11/15/2023    8:02 AM 11/13/2022    8:34 AM 11/10/2021    8:07 AM 03/25/2019    8:09 AM 08/07/2016    8:31 AM  Depression screen PHQ 2/9  Decreased Interest 0 0 0 0 0  Down, Depressed, Hopeless 0 0 0 0 0  PHQ - 2 Score 0 0 0 0 0  Altered sleeping  1     Tired, decreased energy  1     Change in appetite  0     Feeling bad or failure about yourself   0     Trouble concentrating  0     Moving slowly or fidgety/restless  0     Suicidal thoughts  0     PHQ-9 Score  2     Difficult doing work/chores  Not difficult at all         11/13/2022    8:34 AM  GAD 7 : Generalized Anxiety Score  Nervous, Anxious, on Edge 0   Control/stop worrying 0  Worry too much - different things 0  Trouble relaxing 0  Restless 0  Easily annoyed or irritable 0  Afraid - awful might happen 0  Total GAD 7 Score 0  Anxiety Difficulty Not difficult at all    There is no immunization history on file for this patient.  Past Medical History:  Diagnosis Date   Adnexal mass    Family history of ovarian cancer    Ovarian cyst    Pneumonia due to COVID-19 virus 05/21/2019   No Known Allergies Past Surgical History:  Procedure Laterality Date   ADENOIDECTOMY     ROBOTIC ASSISTED BILATERAL SALPINGO OOPHERECTOMY Bilateral 01/04/2022   Procedure: XI ROBOTIC ASSISTED BILATERAL SALPINGO OOPHORECTOMY;  Surgeon: Viktoria Comer SAUNDERS, MD;  Location: [facility];  Service: Gynecology;  Laterality: Bilateral;   WISDOM TOOTH EXTRACTION     Family History  Problem Relation Age of Onset   Drug abuse Mother    COPD Mother    Mental retardation Mother    Early death Mother 110       blood clot   Ovarian cancer Mother 53       dx 'late 12/21/2023', died at 82 due to COPD  Heart disease Mother    Breast cancer Paternal Aunt 50       is now in 26's   Alcohol abuse Sister    Heart disease Maternal Uncle        died late 66's   Heart attack Maternal Grandmother 1   Social History   Social History Narrative   ** Merged History Encounter **       Husband, 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  Smoke detector in the home. Wears her set belt. Feels safe in her relationships.      Allergies as of 11/15/2023   No Known Allergies      Medication List        Accurate as of November 15, 2023  8:12 AM. If you have any questions, ask your nurse or doctor.          STOP taking these medications    Estradiol  10 MCG Tabs vaginal tablet Stopped by: Charlies Bellini       TAKE these medications    ALOE VERA JUICE PO Take 118 mLs by mouth daily.   APPLE CIDER VINEGAR PO Take 15 mLs by  mouth daily.   b complex vitamins capsule Take 1 capsule by mouth daily.   BROMELAIN PO Take 140 mg by mouth daily.   GINGER PO Take 150 mg by mouth daily.   ketoconazole  2 % cream Commonly known as: NIZORAL  1 application to affected area Externally Once a day; Duration: 30 days   Magnesium 200 MG Tabs Take 200 mg by mouth daily.   multivitamin tablet Take 1 tablet by mouth daily.   OIL OF OREGANO PO Take 230 mg by mouth daily.   OMEGA 3 PO Take 292 mg by mouth daily.   OVER THE COUNTER MEDICATION Take 500 mg by mouth daily. Bacopa   OVER THE COUNTER MEDICATION Take 2 capsules by mouth daily. Adrenal Health - 520 mg per capsule   OVER THE COUNTER MEDICATION Take 36 mg by mouth daily. Curcuminoids   OVER THE COUNTER MEDICATION Take 450 mg by mouth daily. Reishi Mushroom   OVER THE COUNTER MEDICATION Take 1 capsule by mouth daily. Estrotone: Evening Primrose Oil - 200 mg, Schizandra - 160 mg, Ginger - 50 mg, Black Cohosh - 40 mg, Chaste Tree - 40 mg, Rosemary - 10 mg   Potassium 99 MG Tabs Take 99 mg by mouth daily.   Probiotic Acidophilus Caps Take 1 capsule by mouth daily.   Quercetin 50 MG Tabs Take 50 mg by mouth daily.   selenium 200 MCG Tabs tablet Take 200 mcg by mouth daily.   Spirulina 500 MG Tabs Take 500 mg by mouth daily.   TURMERIC PO Take 482 mg by mouth daily.   vitamin C 250 MG tablet Commonly known as: ASCORBIC ACID Take 250 mg by mouth daily.   Vitamin D 125 MCG (5000 UT) Caps Take 5,000 Units by mouth daily.   Zinc 30 MG Caps Take 30 mg by mouth daily.        All past medical history, surgical history, allergies, family history, immunizations andmedications were updated in the EMR today and reviewed under the history and medication portions of their EMR.     No results found for this or any previous visit (from the past 2160 hours).  No results found.   Review of Systems  All other systems reviewed and are  negative.  14 pt review of systems performed and  negative (unless mentioned in an HPI)  Objective: BP 120/80   Pulse 67   Temp 97.9 F (36.6 C)   Ht 5' 6 (1.676 m)   Wt 156 lb 6.4 oz (70.9 kg)   LMP 02/24/2019 (Approximate)   SpO2 97%   BMI 25.24 kg/m  Physical Exam Vitals and nursing note reviewed.  Constitutional:      General: She is not in acute distress.    Appearance: Normal appearance. She is not ill-appearing or toxic-appearing.  HENT:     Head: Normocephalic and atraumatic.     Right Ear: Tympanic membrane, ear canal and external ear normal. There is no impacted cerumen.     Left Ear: Tympanic membrane, ear canal and external ear normal. There is no impacted cerumen.     Nose: No congestion or rhinorrhea.     Mouth/Throat:     Mouth: Mucous membranes are moist.     Pharynx: Oropharynx is clear. No oropharyngeal exudate or posterior oropharyngeal erythema.  Eyes:     General: No scleral icterus.       Right eye: No discharge.        Left eye: No discharge.     Extraocular Movements: Extraocular movements intact.     Conjunctiva/sclera: Conjunctivae normal.     Pupils: Pupils are equal, round, and reactive to light.  Cardiovascular:     Rate and Rhythm: Normal rate and regular rhythm.     Pulses: Normal pulses.     Heart sounds: Normal heart sounds. No murmur heard.    No friction rub. No gallop.  Pulmonary:     Effort: Pulmonary effort is normal. No respiratory distress.     Breath sounds: Normal breath sounds. No stridor. No wheezing, rhonchi or rales.  Chest:     Chest wall: No tenderness.  Abdominal:     General: Abdomen is flat. Bowel sounds are normal. There is no distension.     Palpations: Abdomen is soft. There is no mass.     Tenderness: There is no abdominal tenderness. There is no right CVA tenderness, left CVA tenderness, guarding or rebound.     Hernia: No hernia is present.  Musculoskeletal:        General: No swelling, tenderness or  deformity. Normal range of motion.     Cervical back: Normal range of motion and neck supple. No rigidity or tenderness.     Right lower leg: No edema.     Left lower leg: No edema.  Lymphadenopathy:     Cervical: No cervical adenopathy.  Skin:    General: Skin is warm and dry.     Coloration: Skin is not jaundiced or pale.     Findings: No bruising, erythema, lesion or rash.  Neurological:     General: No focal deficit present.     Mental Status: She is alert and oriented to person, place, and time. Mental status is at baseline.     Cranial Nerves: No cranial nerve deficit.     Sensory: No sensory deficit.     Motor: No weakness.     Coordination: Coordination normal.     Gait: Gait normal.     Deep Tendon Reflexes: Reflexes normal.  Psychiatric:        Mood and Affect: Mood normal.        Behavior: Behavior normal.        Thought Content: Thought content normal.        Judgment: Judgment normal.  No results found.  Assessment/plan: Mary Warren is a 58 y.o. female present for CPE  Encounter for preventive health examination Patient was encouraged to exercise greater than 150 minutes a week. Patient was encouraged to choose a diet filled with fresh fruits and vegetables, and lean meats. AVS provided to patient today for education/recommendation on gender specific health and safety maintenance. Colon cancer screen: No FHX, cologuard completed 11/23/2021- neg- rpt 3 yrs Mammogram: FH in Pat aunt (60), Ovarian cancer (<30), pts  mammogram normal 02/08/2022 > ordered at [facility] ordered again for her Cervical cancer screening: Ovarian cancer in mother. Pap smear utd1 w/ GYN- Dr. Jannis Immunizations: td due declined, flu vaccination (encouraged yearly), shingrix declined Infectious disease screening: HIV completed 2012, Hep C completed DEXA: routine screen at 60  Return in about 1 year (around 11/15/2024) for cpe (20 min).    Orders Placed This Encounter  Procedures   MM  3D SCREENING MAMMOGRAM BILATERAL BREAST   CBC   Comprehensive metabolic panel with GFR   Hemoglobin A1c   Lipid panel   TSH   No orders of the defined types were placed in this encounter.  Referral Orders  No referral(s) requested today     Electronically signed by: Charlies Bellini, DO [facility]

## 2023-11-15 NOTE — Patient Instructions (Addendum)

## 2023-11-16 ENCOUNTER — Ambulatory Visit: Payer: Self-pay | Admitting: Family Medicine

## 2023-11-16 LAB — TSH: TSH: 2.88 u[IU]/mL (ref 0.35–5.50)

## 2024-01-15 ENCOUNTER — Ambulatory Visit: Admitting: Family Medicine

## 2024-01-15 VITALS — BP 120/78 | HR 76 | Temp 98.3°F | Wt 160.0 lb

## 2024-01-15 DIAGNOSIS — H1031 Unspecified acute conjunctivitis, right eye: Secondary | ICD-10-CM

## 2024-01-15 MED ORDER — ERYTHROMYCIN 5 MG/GM OP OINT: 1.0000 | TOPICAL_OINTMENT | Freq: Every day | OPHTHALMIC | 0 refills | Status: AC

## 2024-01-15 NOTE — Patient Instructions (Addendum)
         Great to see you today.  I have refilled the medication(s) we provide.   If labs were collected or images ordered, we will inform you of  results once we have received them and reviewed. We will contact you either by echart message, or telephone call.  Please give ample time to the testing facility, and our office to run,  receive and review results. Please do not call inquiring of results, even if you can see them in your chart. We will contact you as soon as we are able. If it has been over 1 week since the test was completed, and you have not yet heard from us , then please call us .    - echart message- for normal results that have been seen by the patient already.   - telephone call: abnormal results or if patient has not viewed results in their echart.  If a referral to a specialist was entered for you, please call us  in 2 weeks if you have not heard from the specialist office to schedule.

## 2024-01-15 NOTE — Progress Notes (Signed)
 Mary Warren , 04-24-1966, 58 y.o., female MRN: [id number] Patient Care Team    Relationship Specialty Notifications Start End  Catherine Charlies LABOR, DO PCP - General Family Medicine  10/26/16   Jannis Kate Norris, MD Consulting Physician Obstetrics and Gynecology  03/25/19     Chief Complaint  Patient presents with   Eye Problem    Since Saturday; R eye. Redness, some irritation. Pt mentions having rash over a week. Pt has not tried anything to relieve sx.      Subjective: Mary Warren is a 58 y.o. Pt presents for an OV with complaints of right eye discomfort and redness of 4 days duration.  Associated symptoms include irritation.  Patient reports she has not noticed any drainage from her.  Maybe slightly watery.  Vision is intact without changes.  She has a mild rash on her left forearm and is uncertain the etiology and does not know if it correlates with the high symptoms.  She had a mild upper respiratory infection that started a little over 7 days ago improved.  She denies any allergy symptoms.  Pt has tried nothing to ease their symptoms.      11/15/2023    8:02 AM 11/13/2022    8:34 AM 11/10/2021    8:07 AM 03/25/2019    8:09 AM 08/07/2016    8:31 AM  Depression screen PHQ 2/9  Decreased Interest 0 0 0 0 0  Down, Depressed, Hopeless 0 0 0 0 0  PHQ - 2 Score 0 0 0 0 0  Altered sleeping  1     Tired, decreased energy  1     Change in appetite  0     Feeling bad or failure about yourself   0     Trouble concentrating  0     Moving slowly or fidgety/restless  0     Suicidal thoughts  0     PHQ-9 Score  2     Difficult doing work/chores  Not difficult at all       No Known Allergies Social History   Social History Narrative   ** Merged History Encounter **       Husband, Gwendlyn. No children. 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  Smoke detector in the home. Wears her set belt. Feels safe in her relationships.     Past Medical History:   Diagnosis Date   Adnexal mass    Family history of ovarian cancer    Ovarian cyst    Pneumonia due to COVID-19 virus 05/21/2019   Past Surgical History:  Procedure Laterality Date   ADENOIDECTOMY     ROBOTIC ASSISTED BILATERAL SALPINGO OOPHERECTOMY Bilateral 01/04/2022   Procedure: XI ROBOTIC ASSISTED BILATERAL SALPINGO OOPHORECTOMY;  Surgeon: Viktoria Comer SAUNDERS, MD;  Location: [facility];  Service: Gynecology;  Laterality: Bilateral;   WISDOM TOOTH EXTRACTION     Family History  Problem Relation Age of Onset   Drug abuse Mother    COPD Mother    Mental retardation Mother    Early death Mother 67       blood clot   Ovarian cancer Mother 47       dx 'late 02/10/24', died at 75 due to COPD   Heart disease Mother    Breast cancer Paternal Aunt 31       is now in 106's   Alcohol abuse Sister    Heart disease Maternal Uncle  died late 69's   Heart attack Maternal Grandmother 71   Allergies as of 01/15/2024   No Known Allergies      Medication List        Accurate as of January 15, 2024  1:08 PM. If you have any questions, ask your nurse or doctor.          ALOE VERA JUICE PO Take 118 mLs by mouth daily.   APPLE CIDER VINEGAR PO Take 15 mLs by mouth daily.   b complex vitamins capsule Take 1 capsule by mouth daily.   BROMELAIN PO Take 140 mg by mouth daily.   erythromycin  ophthalmic ointment Place 1 Application into the right eye at bedtime for 7 days.   GINGER PO Take 150 mg by mouth daily.   ketoconazole  2 % cream Commonly known as: NIZORAL  1 application to affected area Externally Once a day; Duration: 30 days   Magnesium 200 MG Tabs Take 200 mg by mouth daily.   multivitamin tablet Take 1 tablet by mouth daily.   OIL OF OREGANO PO Take 230 mg by mouth daily.   OMEGA 3 PO Take 292 mg by mouth daily.   OVER THE COUNTER MEDICATION Take 500 mg by mouth daily. Bacopa   OVER THE COUNTER MEDICATION Take 2 capsules by mouth daily. Adrenal  Health - 520 mg per capsule   OVER THE COUNTER MEDICATION Take 36 mg by mouth daily. Curcuminoids   OVER THE COUNTER MEDICATION Take 450 mg by mouth daily. Reishi Mushroom   OVER THE COUNTER MEDICATION Take 1 capsule by mouth daily. Estrotone: Evening Primrose Oil - 200 mg, Schizandra - 160 mg, Ginger - 50 mg, Black Cohosh - 40 mg, Chaste Tree - 40 mg, Rosemary - 10 mg   Potassium 99 MG Tabs Take 99 mg by mouth daily.   Probiotic Acidophilus Caps Take 1 capsule by mouth daily.   Quercetin 50 MG Tabs Take 50 mg by mouth daily.   selenium 200 MCG Tabs tablet Take 200 mcg by mouth daily.   Spirulina 500 MG Tabs Take 500 mg by mouth daily.   TURMERIC PO Take 482 mg by mouth daily.   vitamin C 250 MG tablet Commonly known as: ASCORBIC ACID Take 250 mg by mouth daily.   Vitamin D 125 MCG (5000 UT) Caps Take 5,000 Units by mouth daily.   Zinc 30 MG Caps Take 30 mg by mouth daily.        All past medical history, surgical history, allergies, family history, immunizations andmedications were updated in the EMR today and reviewed under the history and medication portions of their EMR.     ROS Negative, with the exception of above mentioned in HPI   Objective:  BP 120/78   Pulse 76   Temp 98.3 F (36.8 C)   Wt 160 lb (72.6 kg)   LMP 02/24/2019 (Approximate)   SpO2 98%   BMI 25.82 kg/m  Body mass index is 25.82 kg/m.  Physical Exam Vitals and nursing note reviewed.  Constitutional:      General: She is not in acute distress.    Appearance: Normal appearance. She is normal weight. She is not ill-appearing or toxic-appearing.  HENT:     Head: Normocephalic and atraumatic.  Eyes:     General: Lids are normal. Vision grossly intact. Gaze aligned appropriately. No visual field deficit or scleral icterus.       Right eye: No foreign body, discharge or hordeolum.  Left eye: No discharge.     Extraocular Movements: Extraocular movements intact.     Right  eye: Normal extraocular motion and no nystagmus.     Conjunctiva/sclera:     Right eye: Right conjunctiva is injected. No chemosis, exudate or hemorrhage.    Pupils: Pupils are equal, round, and reactive to light.     Comments: No pain to global pressure.  Watery.  No purulent drainage  Skin:    Findings: No rash.  Neurological:     Mental Status: She is alert and oriented to person, place, and time. Mental status is at baseline.     Motor: No weakness.     Coordination: Coordination normal.     Gait: Gait normal.  Psychiatric:        Mood and Affect: Mood normal.        Behavior: Behavior normal.        Thought Content: Thought content normal.        Judgment: Judgment normal.      No results found. No results found. No results found for this or any previous visit (from the past 24 hours).  Assessment/Plan: Jacey Leaks is a 58 y.o. female present for OV for  Acute conjunctivitis of right eye, unspecified acute conjunctivitis type Suspect either an early bacterial infection of the eye versus irritant.  She does have a contact dermatitis on her left forearm, she could have possibly also had an irritant to her right eye. Conjunctivitis does not classically fit into bacterial conjunctivitis with report of her symptoms and symptoms possibly more from an irritant. Elected to treat with erythromycin  ophthalmic nightly x 5 to 7 days. Patient declined topical steroids for her arm or IM Depo-Medrol injection to help with inflammation. Discussed ophthalmology evaluation if symptoms worsen, or eye pain/vision changes occur.  Reviewed expectations re: course of current medical issues. Discussed self-management of symptoms. Outlined signs and symptoms indicating need for more acute intervention. Patient verbalized understanding and all questions were answered. Patient received an After-Visit Summary.    No orders of the defined types were placed in this encounter.  Meds ordered this  encounter  Medications   erythromycin  ophthalmic ointment    Sig: Place 1 Application into the right eye at bedtime for 7 days.    Dispense:  3.5 g    Refill:  0   Referral Orders  No referral(s) requested today     Note is dictated utilizing voice recognition software. Although note has been proof read prior to signing, occasional typographical errors still can be missed. If any questions arise, please do not hesitate to call for verification.   electronically signed by:  Charlies Bellini, DO  [facility] [place]

## 2024-11-17 ENCOUNTER — Encounter: Payer: Self-pay | Admitting: Family Medicine
